# Patient Record
Sex: Female | Born: 1950
Health system: Southern US, Community
[De-identification: ages and names within clinical notes are randomized; demographics above are authoritative.]

## PROBLEM LIST (undated history)

## (undated) DIAGNOSIS — Z8489 Family history of other specified conditions: Secondary | ICD-10-CM

## (undated) DIAGNOSIS — M1990 Unspecified osteoarthritis, unspecified site: Secondary | ICD-10-CM

## (undated) DIAGNOSIS — F419 Anxiety disorder, unspecified: Secondary | ICD-10-CM

## (undated) DIAGNOSIS — N393 Stress incontinence (female) (male): Secondary | ICD-10-CM

## (undated) HISTORY — PX: TONSILLECTOMY: SUR1361

## (undated) HISTORY — PX: OTHER SURGICAL HISTORY: SHX169

## (undated) HISTORY — PX: EYE SURGERY: SHX253

---

## 1998-06-06 ENCOUNTER — Other Ambulatory Visit: Admission: RE | Admit: 1998-06-06 | Discharge: 1998-06-06 | Payer: Self-pay | Admitting: General Surgery

## 2001-01-20 ENCOUNTER — Encounter: Admission: RE | Admit: 2001-01-20 | Discharge: 2001-01-20 | Payer: Self-pay | Admitting: Family Medicine

## 2001-01-20 ENCOUNTER — Encounter: Payer: Self-pay | Admitting: Family Medicine

## 2001-02-17 ENCOUNTER — Other Ambulatory Visit: Admission: RE | Admit: 2001-02-17 | Discharge: 2001-02-17 | Payer: Self-pay | Admitting: Obstetrics and Gynecology

## 2002-04-05 ENCOUNTER — Other Ambulatory Visit: Admission: RE | Admit: 2002-04-05 | Discharge: 2002-04-05 | Payer: Self-pay | Admitting: Obstetrics and Gynecology

## 2002-05-24 ENCOUNTER — Encounter: Payer: Self-pay | Admitting: Obstetrics and Gynecology

## 2002-05-24 ENCOUNTER — Encounter: Admission: RE | Admit: 2002-05-24 | Discharge: 2002-05-24 | Payer: Self-pay | Admitting: Obstetrics and Gynecology

## 2002-06-28 ENCOUNTER — Encounter: Payer: Self-pay | Admitting: Orthopedic Surgery

## 2002-07-05 ENCOUNTER — Inpatient Hospital Stay (HOSPITAL_COMMUNITY): Admission: RE | Admit: 2002-07-05 | Discharge: 2002-07-08 | Payer: Self-pay | Admitting: Orthopedic Surgery

## 2003-11-01 ENCOUNTER — Inpatient Hospital Stay (HOSPITAL_COMMUNITY): Admission: RE | Admit: 2003-11-01 | Discharge: 2003-11-04 | Payer: Self-pay | Admitting: Orthopedic Surgery

## 2004-07-04 ENCOUNTER — Other Ambulatory Visit: Admission: RE | Admit: 2004-07-04 | Discharge: 2004-07-04 | Payer: Self-pay | Admitting: Obstetrics and Gynecology

## 2005-08-10 ENCOUNTER — Other Ambulatory Visit: Admission: RE | Admit: 2005-08-10 | Discharge: 2005-08-10 | Payer: Self-pay | Admitting: Obstetrics and Gynecology

## 2005-08-24 ENCOUNTER — Encounter: Admission: RE | Admit: 2005-08-24 | Discharge: 2005-08-24 | Payer: Self-pay | Admitting: Obstetrics and Gynecology

## 2006-03-04 ENCOUNTER — Other Ambulatory Visit: Admission: RE | Admit: 2006-03-04 | Discharge: 2006-03-04 | Payer: Self-pay | Admitting: Obstetrics and Gynecology

## 2006-03-15 ENCOUNTER — Encounter: Admission: RE | Admit: 2006-03-15 | Discharge: 2006-03-15 | Payer: Self-pay | Admitting: Obstetrics and Gynecology

## 2006-08-16 ENCOUNTER — Encounter: Admission: RE | Admit: 2006-08-16 | Discharge: 2006-08-16 | Payer: Self-pay | Admitting: Obstetrics and Gynecology

## 2006-10-01 ENCOUNTER — Encounter: Admission: RE | Admit: 2006-10-01 | Discharge: 2006-10-01 | Payer: Self-pay | Admitting: Obstetrics and Gynecology

## 2007-08-09 ENCOUNTER — Encounter: Admission: RE | Admit: 2007-08-09 | Discharge: 2007-08-09 | Payer: Self-pay | Admitting: Obstetrics and Gynecology

## 2007-12-22 ENCOUNTER — Encounter: Admission: RE | Admit: 2007-12-22 | Discharge: 2007-12-22 | Payer: Self-pay | Admitting: Obstetrics and Gynecology

## 2008-09-05 ENCOUNTER — Encounter: Admission: RE | Admit: 2008-09-05 | Discharge: 2008-09-05 | Payer: Self-pay | Admitting: Obstetrics and Gynecology

## 2009-10-11 ENCOUNTER — Encounter: Admission: RE | Admit: 2009-10-11 | Discharge: 2009-10-11 | Payer: Self-pay | Admitting: Obstetrics and Gynecology

## 2010-11-03 ENCOUNTER — Other Ambulatory Visit: Payer: Self-pay | Admitting: Obstetrics and Gynecology

## 2010-11-03 DIAGNOSIS — Z1231 Encounter for screening mammogram for malignant neoplasm of breast: Secondary | ICD-10-CM

## 2010-11-12 ENCOUNTER — Ambulatory Visit
Admission: RE | Admit: 2010-11-12 | Discharge: 2010-11-12 | Disposition: A | Discharge status: Home / Self Care | Payer: BC Managed Care – PPO | Source: Ambulatory Visit | Attending: Obstetrics and Gynecology | Admitting: Obstetrics and Gynecology

## 2010-11-12 DIAGNOSIS — Z1231 Encounter for screening mammogram for malignant neoplasm of breast: Secondary | ICD-10-CM

## 2010-12-12 NOTE — Discharge Summary (Signed)
Jacqueline Dunn, Jacqueline Dunn                      ACCOUNT NO.:  [id number]   MEDICAL RECORD NO.:  [id number]                   PATIENT TYPE:  INP   LOCATION:  0481                                 FACILITY:  [facility]   PHYSICIAN:  Vania Rea. Supple, M.D.               DATE OF BIRTH:  03/20/1951   DATE OF ADMISSION:  11/01/2003  DATE OF DISCHARGE:  11/04/2003                                 DISCHARGE SUMMARY   ADMISSION DIAGNOSES:  1. Left knee medial joint osteoarthritis.  2. Hypercholesterolemia.   DISCHARGE DIAGNOSES:  1. Left knee medial joint osteoarthritis.  2. Hypercholesterolemia.   OPERATIONS:  Left knee medial unicondylar arthroplasty.  Surgeon Dr. Francena Hanly, assistant Ralene Bathe, P.A.-C., under general anesthesia.   BRIEF HISTORY:  Ms. Jacqueline Dunn is a very pleasant 60 year old female who is well  known to [place] that we have been following for medial joint OA.  She has  previously undergone a right medial unicondylar arthroplasty and done  extremely well.  She has now progressed to the left knee being extremely  symptomatic.  All of her joint line has been medial.  We did note some mild  changes in the lateral joint line of the patellofemoral joint on x-ray.  However, these were not symptomatic.  The decision was made to go to the  operating room to do a left unicondylar arthroplasty versus a total knee  depending on symptoms and findings intraoperatively.  Risks and benefits  discussed with the patient at length and she is in agreement and wishes to  proceed.   HOSPITAL COURSE:  The patient was admitted and underwent the above-named  procedure and tolerated this well.  All appropriate IV antibiotics and  analgesics were utilized. Postoperatively she was placed on 24 hours of IV  antibiotics and Coumadin for 3 weeks for DVT and PE prophylaxis.  The  patient did extremely well postoperatively.  She met all of her therapy  goals.  By date November 04, 2003 she was stable and ready  for discharge.  She  was discharged home in the care of her family.   LABORATORY DATA:  Hemoglobin 13.7 on admission, postoperatively 11.1 prior  to discharge and stable.  Chemistries within normal limits postoperatively.  Protime was followed by pharmacy on Coumadin for DVT prophylaxis.  Urinalysis negative.  Blood type shows O positive.  EKG showed normal sinus  rhythm.  Preoperative left knee showed degenerative changes most notably to  the medial aspect.   CONDITION ON DISCHARGE:  Stable and improved.   DISCHARGE MEDICATIONS AND PLAN:  1. The patient is being discharged to home.  2. Prescription for provided for Coumadin, Restoril, Robaxin, and Percocet.  3. She is weightbearing as tolerated.  4. Home health physical therapy.  5. Follow up 2 weeks in our office, call for a time.  6. Resume home medications, home diet.     Tracy A. Shuford, P.A.-C.  Vania Rea. Supple, M.D.    TAS/MEDQ  D:  11/22/2003  T:  11/22/2003  Job:  [id number]

## 2010-12-12 NOTE — Op Note (Signed)
NAME:  Jacqueline Dunn, Jacqueline Dunn                      ACCOUNT NO.:  [id number]   MEDICAL RECORD NO.:  [id number]                   PATIENT TYPE:  INP   LOCATION:  X001                                 FACILITY:  [facility]   PHYSICIAN:  Vania Rea. Supple, M.D.               DATE OF BIRTH:  1950/12/01   DATE OF PROCEDURE:  11/01/2003  DATE OF DISCHARGE:                                 OPERATIVE REPORT   PREOPERATIVE DIAGNOSES:  Left knee medial compartment osteoarthrosis.   POSTOPERATIVE DIAGNOSES:  Left knee medial compartment osteoarthrosis.   PROCEDURE:  Left knee medial compartment unicondylar arthroplasty.   SURGEON:  Vania Rea. Supple, M.D.   Threasa HeadsFrench Ana A. Shuford, P.A.-C.   ANESTHESIA:  General endotracheal.   TOURNIQUET TIME:  1 hour and 25 minutes.   ESTIMATED BLOOD LOSS:  150 mL   DRAINS:  None.   IMPLANTS:  Depuy size 2 all polyethylene tibial insert and a size 3  unicondylar femur.   BRIEF HISTORY:  Jacqueline Dunn is a 60 year old female whose had known bilateral  knee medial compartment arthrosis who is now approximately a year and a half  out from her right knee medial compartment unicondylar placement.  This has  done very nicely. The left knee has now become increasingly symptomatic to  the point where she is having significant pain and functional limitations.  Plain radiographs confirm severe medial compartment arthrosis. There is also  some evidence for degenerative changes in the patellofemoral joint and  lateral compartment and preoperatively discussed the potential need for  total knee replacement versus a unicondylar replacement. Jacqueline Dunn  understands and agrees with our plan for unicondylar total knee replacement  based on the intraoperative findings.  We did discuss the possible surgical  complications of bleeding, infection, neurovascular injury, DVT, PE, as well  as persistent pain and possible need for revision to a total knee  arthroplasty.  She understands  and accepts and agrees with our planned  procedure.   DESCRIPTION OF PROCEDURE:  After undergoing routine preoperative evaluation,  the patient received prophylactic antibiotics.  Placed supine on the  operating table and underwent the smooth induction of general endotracheal  anesthesia. Tourniquet applied to the left thigh, left leg was prepped and  draped in a standard fashion. The leg was exsanguinated with the tourniquet  inflated to 350 mmHg.  A medial parapatellar longitudinal incision was made  from one fingerbreadth above the patella to just medial to the tibial  tubercle, total length approximately 10 cm.  Skin flaps were mobilized,  electrocautery used for hemostasis.  A medial parapatellar arthrotomy was  then performed using electrocautery.  We removed approximately a 1/4 of the  infrapatellar fat pad to improve visualization.  We then carefully released  the meniscotibial ligament medially and reflected the medial meniscus to  allow visualization of the joint space.  There was complete loss of all  articular cartilage on the  distal aspect of the medial femoral condyle.  The  patellofemoral joint and lateral compartments were carefully inspected and  the articular cartilage showed some generalized softening but no significant  fissuring or fibrillations and certainly no full thickness defects. Some  peripheral osteophytes were noted. In general, however, the articular  cartilage appeared to be in generally good condition both the patellofemoral  joint and the lateral compartment. With this finding, we elected to proceed  with the unicondylar replacement.  Our attention was initially directed to  the tibial plateau.  We used an extramedullary guide and maintaining a  neutral alignment, a transverse cut was made removing approximately 4 mm of  bone from the medial tibial plateau.  Care was taken to protect the  surrounding soft tissues.  A sagittal saw was then used to make the  sagittal  cut just to the lateral margin of the corresponding point of the lateral  aspect of the medial femoral condyle.  The bone wedge was then removed in  total from the medial tibia.  A trial spacer was then placed and this showed  appropriate resection of bone and good soft tissue balance at this early  stage.  The knee was then placed into extension and utilizing a standard  wedge with a 2 mm step, we pinned for the distal femoral cut.  The distal  femoral cut was then performed and a very small portion of bone was removed.  The cutting guide was then removed.  The distal femoral chamfer guide was  then applied on the distal femoral cut with the knee in flexion. The chamfer  cuts were then made beginning with the anterior and then the posterior and  the oblique chamfer cuts on the femur. We did use a curved gauge to recess  the anterior margin of the femoral implant so that it was flush and had a  smooth contour and smooth transition into the trochlear groove region.  Once  this was completed, trial implants were placed and there was excellent fit  of the femoral component and appropriate sizing of the tibial tray. The size  3 femur had the best fit and the size 2 tibial tray had the best fit.  At  this point, we then placed the keel cutting guide on the distal femur and  utilized a bur to complete the keel and also drill the stabilizing peg hole  into the distal femur. A trial implant was then placed with the trial keel  and excellent fit and good mobility and good stability implants were  confirmed. The trial femur was then removed. The tibial keel cutting guide  was then placed into position. We confirmed that there was appropriate fit  of the tibial tray, proper positioning and then a bur was then used again to  cut the keel on the tibia and then the gouges then used to complete the keel cut for the tibial tray and then a final keeled trial was placed. Once this  was completed,  the joint was then copiously irrigated. The final implants  were then opened and the final implants were then placed into position on a  trial basis and there was excellent fit and position of the implants, good  knee stability in both flexion and extension and good patella tracking.  The  implants were then removed. The knee joint was then pulsatilely lavaged and  meticulous hemostasis was obtained.  The cement was then mixed on the back  table in the  appropriate consistency, the implants were then cemented into  position. Prior to placement of the tibial implant, we did take 10 mL of  0.5% Marcaine with epinephrine and instilled it into the posterior capsular  tissues.  We placed a 4 x 4 sponge in the posterior aspect of the knee prior  to cementing.  The tibial tray was then cemented into position and then the  Raytec was removed to retrieve any cement which had extruded posteriorly.  The femoral condylar implant was then cemented into position and had  excellent fit and position and all extra cement was meticulously removed.  The knee was then held in extension as the cement hardened. Once this was  completed, final inspection and debridement was performed. Soft tissue  balance was excellent in both flexion and extension. Good knee stability  throughout full range of motion.  At this point the tourniquet was then let  down. Hemostasis was obtained.  The incision was then closed with  interrupted figure-of-eight #1 Vicryl sutures for the synovium and medial  parapatellar arthrotomy, 2-0 Vicryl for the subcu and intracuticular 3-0  Monocryl for the skin followed by Steri-Strips.  A bulky dry dressing and  ice pack were placed about the knee.  The patient was then extubated and  taken to the recovery room in stable condition.                                               Vania Rea. Supple, M.D.    KMS/MEDQ  D:  11/01/2003  T:  11/01/2003  Job:  [id number]

## 2010-12-12 NOTE — Op Note (Signed)
NAME:  Jacqueline Dunn, Jacqueline Dunn                      ACCOUNT NO.:  [id number]   MEDICAL RECORD NO.:  [id number]                   PATIENT TYPE:  INP   LOCATION:  X002                                 FACILITY:  [facility]   PHYSICIAN:  Vania Rea. Supple, M.D.               DATE OF BIRTH:  10-18-1950   DATE OF PROCEDURE:  07/05/2002  DATE OF DISCHARGE:                                 OPERATIVE REPORT   PREOPERATIVE DIAGNOSIS:  Right knee end-stage medial compartment  osteoarthrosis.   POSTOPERATIVE DIAGNOSIS:  Right knee end-stage medial compartment  osteoarthrosis.   PROCEDURE:  Cemented DePuy unicompartmental arthroplasty utilizing a size 2  femur and a size 2 tibia.   SURGEON:  Vania Rea. Supple, M.D.   ASSISTANT:  Ollen Gross, M.D.   ANESTHESIA:  General endotracheal.   TOURNIQUET TIME:  1 hour 10 minutes.   ESTIMATED BLOOD LOSS:  Minimal.   DRAINS:  Hemovac x1.   HISTORY:  The patient is a 60 year old female who has had progressively  increasing bilateral knee pain with clinical and radiographic evidence for  end-stage medial compartment arthrosis.  She has failed prolonged attempts  at conservative management, is now so functionally incapacitated that she  presents today for planned unicompartmental replacement of the right knee.   Preoperatively she was counseled on treatment options as well as risks  versus benefits thereof.  Possible surgical complications of bleeding,  infection, neurovascular injury, DVT, PE, persistence of pain, loss of  motion, and possible need for revision surgery, were all reviewed.  She  understands and accepts and agrees with our planned procedure.   DESCRIPTION OF PROCEDURE:  After undergoing routine preoperative evaluation,  the patient received prophylactic antibiotics.  Placed supine on the  operating table and underwent smooth induction of general endotracheal  anesthesia.  Tourniquet applied to the right thigh, and the right leg was  sterilely prepped and draped in the standard fashion.  The leg was  exsanguinated with the tourniquet inflated to 300 mmHg.  A paramedial  longitudinal incision was then made approximately 11 cm in length just along  the medial border of the patella from the tibial tubercle extending  proximally.  Skin flaps were mobilized medially and laterally and  electrocautery was used for hemostasis.  We then made the medial  parapatellar arthrotomy.  The anterior horn of the meniscus was divided, and  we then performed a very slight medial release to allow exposure.  Retractors were placed into the intercondylar notch to retract the contents  of the intercondylar notch, and inspection of the medial compartment showed  complete loss of articular cartilage on the femoral condyle over the  weightbearing portion as well as over the tibial plateau.  We then inspected  the patellofemoral joint and the lateral compartment, and all these areas  showed the articular surfaces to be in excellent condition.  At this point  an extramedullary guide was then applied  to the tibia and the cutting guide  for the tibial cut was then placed in proper position and using a feeler  gauge, we measured for a total of a 4 mm cut from the medial tibial plateau.  The cutting guide was then pegged into position after we determined the  appropriate posterior slope in varus and valgus alignment.  An oscillating  saw was then passed across the medial aspect of the tibia, completing the  cut.  A reciprocating saw was then used to make the appropriate sagittal cut  through the medial tibial metaphysis adjacent to the tibial tubercle and  taking care to protect the contents of the intercondylar notch.  The medial  tibial plateau segment was then removed and we confirmed that there was an  appropriate posterior slope.  At this point the cutting guide was then  removed.  The 7 mm trial was then placed, and this showed excellent soft   tissue balance and alignment with the knee in extension as well as in  flexion.  We then placed the knee into extension and applied the distal  femoral cutting guide, and this was then pinned into position.  The distal  femoral cut was then made with the oscillating saw.  The chamfer cutting  guide was then placed, and we made the anterior, posterior, and chamfer cuts  on the distal femur.  We then applied the trial implants and found good  positioning of the implants and good soft tissue balance.  Through the trial  femoral implant we then cut the stabilizing peg hole as well as keel using  the appropriate instrumentation.  We then applied the tibial keel cutting  guide and used a combination of a bur as well as a gouge to cut the keel for  the tibial tray.  We then took the final implants and tamped them into  position and took the knee through a range of motion.  There was excellent  soft tissue balance and full motion, no evidence for lift-off, and no  evidence for soft tissue impingement.  The final implants were then removed,  the joint was lavaged.  Cement was then mixed on the back table and at the  appropriate consistency, it was introduced carefully to the tibia and the  tibial poly tray was inserted in position.  We had placed a Ray-Tec  posteriorly to catch any excess cement, and we carefully debrided the back  of the joint to remove all excess cement.  The femoral implant was then  placed and in a similar fashion was tamped into position and all extra  cement was carefully removed.  Once the cement was appropriately hardened,  we then re-evaluated knee motion and stability, and excellent positioning  and stability were achieved.  The wound was then finally irrigated.  We did  inject the posterior capsule as well as the skin edges with 0.5% Marcaine  with epinephrine.  A drain was then brought out superomedially.  The medial parapatellar arthrotomy was closed with interrupted #1  figure-of-eight  Vicryl sutures.  The tourniquet was let down at this point.  Hemostasis was  obtained.  The subcu was closed with 2-0 Vicryl and a 4-0 Monocryl was used  to close the skin in a running intracuticular fashion, followed by Steri-  Strips.  A dry dressing was applied about the knee.  The leg was wrapped  with an Ace bandage.  The leg was placed in a knee immobilizer.  The patient  was then extubated and taken to the recovery room in stable condition.                                               Vania Rea. Supple, M.D.    KMS/MEDQ  D:  07/05/2002  T:  07/05/2002  Job:  [id number]

## 2010-12-12 NOTE — H&P (Signed)
NAME:  Jacqueline Dunn, Jacqueline Dunn                      ACCOUNT NO.:  [id number]   MEDICAL RECORD NO.:  [id number]                   PATIENT TYPE:  INP   LOCATION:  X001                                 FACILITY:  [facility]   PHYSICIAN:  Vania Rea. Supple, M.D.               DATE OF BIRTH:  [birth year]   DATE OF ADMISSION:  11/01/2003  DATE OF DISCHARGE:                                HISTORY & PHYSICAL   ADMISSION DIAGNOSIS:  Left knee medial joint osteoarthritis.   BRIEF HISTORY:  Jacqueline Dunn is a very pleasant 60 year old female well known  to [place], who approximately a year and a half ago had undergone right knee  unicondylar arthroplasty for isolated medial compartment osteoarthritis.  She has now progressed with the left knee with the same symptoms.  She has  an isolated medial joint pain.  She is in pain now that is requiring  analgesics to get through her daily activities.  At this time, she had done  so well with the other knee that she wishes to proceed with unicondylar  arthroplasty of the left knee.  There was some mild radiographic evidence  for some lateral compartment.  We also discussed the possibility of needing  to convert to a total knee arthroplasty at the time of surgery, if indeed,  she did have significant patellofemoral or lateral compartment arthrosis.  She is in understanding, as the risks and benefits were discussed at length,  and she wishes to proceed.   PAST MEDICAL HISTORY:  Hypercholesterolemia.   PAST SURGICAL HISTORY:  1. Right knee unicondylar arthroplasty.  2. Tonsillectomy.  3. Wisdom tooth extraction.   ALLERGIES:  No known drug allergies.   MEDICATIONS:  1. Zocor 20 mg q.d.  2. Ultram p.r.n.   SOCIAL HISTORY:  She lives in a [place] house with two steps into the  usual entrance.  She will have postoperative help, and plans are for  discharge home with home health PT and RN.   FAMILY HISTORY:  Significant for hypertension and CVA in her mother and  breast cancer.   REVIEW OF SYSTEMS:  Patient denies any recent fevers, chills, night sweats.  No bleeding tendencies.  CNS:  No blurred or double vision, headaches, or  paralysis.  RESPIRATORY:  No shortness of breath, productive cough, or  hemoptysis.  CARDIOVASCULAR:  No chest pain, angina, or orthopnea.  GI:  No  nausea, vomiting, constipation, melena, or bloody stools.  GENITOURINARY:  No dysuria or hematuria.  MUSCULOSKELETAL:  As pertinent in the present  illness.   PHYSICAL EXAMINATION:  VITAL SIGNS:  Blood pressure on the date of history  and physical was 128/82.  GENERAL:  A well-developed and well-nourished 60 year old female.  HEENT:  Normocephalic.  NECK:  Supple.  No lymphadenopathy.  LUNGS:  Clear to auscultation bilaterally.  HEART:  Regular rate and rhythm.  No murmurs, gallops, rubs, heaves, or  thrills.  ABDOMEN:  Soft, nontender.  EXTREMITIES:  Painful medial joint line with mild effusion on the day of  exam.  There is no lateral joint line tenderness.  No patellofemoral  crepitus.   IMPRESSION:  1. Left knee medial joint osteoarthrosis.  2. Hypercholesterolemia.   PLAN:  At this time, she will be admitted for left knee unicompartmental  arthroplasty versus a total knee, if indeed, there is any other joint  involvement.     Tracy A. Shuford, P.A.-C.                 Vania Rea. Supple, M.D.    TAS/MEDQ  D:  11/01/2003  T:  11/02/2003  Job:  [id number]

## 2010-12-12 NOTE — H&P (Signed)
NAME:  Jacqueline Dunn, Jacqueline Dunn                      ACCOUNT NO.:  [id number]   MEDICAL RECORD NO.:  [id number]                   PATIENT TYPE:  INP   LOCATION:  NA                                   [facility]ACILITY:  [facility]   PHYSICIAN:  Vania Rea. Supple, M.D.               DATE OF BIRTH:  [birth year]   DATE OF ADMISSION:  07/05/2002  DATE OF DISCHARGE:                                HISTORY & PHYSICAL   CHIEF COMPLAINT:  Bilateral knee pain.   HISTORY OF PRESENT ILLNESS:  The patient is a 60 year old female who has had  bilateral isolated medial compartment disease in her knees.  She has  significant pain and deformity, and is requiring narcotics just in order to  get about.  She is having difficulty with medial knee pain and swelling.  She has had a previous left knee arthroscopy, and was noted to have isolated  medial compartment arthrosis.  Currently, her right is more symptomatic then  her left.  She has had appropriate MRI's to document isolated disease, and  at this time we have discussed unicompartmental arthroplasty at length with  her.  She wishes to proceed at this time.  Risks and benefits were discussed  at length.   PAST MEDICAL HISTORY:  Hypercholesterolemia.   PAST SURGICAL HISTORY:  1. Tonsillectomy and adenoidectomy.  2. Left knee arthroscopy in 2/03.   MEDICATIONS:  1. Vicodin p.r.n.  2. Ultracet p.r.n.  3. Bextra 20 mg q.d.  4. Zocor 20 mg q.d.   ALLERGIES:  CODEINE causes nausea and vomiting.   SOCIAL HISTORY:  The patient lives with her mother.  She is currently  divorced.  She lives in one story with three steps into the usual entrance.  Does not use tobacco or alcohol products.   FAMILY HISTORY:  Significant for mother with hypertension.   REVIEW OF SYMPTOMS:  The patient denies any recent fevers, chills, night  sweats, no bleeding tendencies.  CNS:  No blurred or double vision,  seizures, headaches, or paralysis.  RESPIRATORY:  No shortness of breath,  productive cough, or hemoptysis.  CARDIOVASCULAR:  No chest pain, angina, or  orthopnea.  GASTROINTESTINAL:  No nausea, vomiting, constipation, bloody  stool, melena.  GENITOURINARY:  No dysuria or hematuria.  MUSCULOSKELETAL:  Pertinent in present illness.   PHYSICAL EXAMINATION:  GENERAL:  A well-developed, well-nourished 51-year-  old female.  VITAL SIGNS:  Blood pressure 108/68.  HEENT:  Normocephalic.  Extraocular movements were intact.  NECK:  Supple, no lymphadenopathy, no carotid bruits.  CHEST:  Clear to auscultation bilaterally.  No rales or rhonchi.  HEART:  Regular rate and rhythm.  ABDOMEN:  Positive bowel sounds.  EXTREMITIES:  Medial joint line tenderness bilaterally with mild joint  effusion.   IMPRESSION:  1. Bilateral knee medial joint isolated osteoarthritis, right more     symptomatic then left.  2. Hypercholesterolemia.   PLAN:  Right unicondylar knee replacement.  Tracy A. Shuford, P.A.-C.                 Vania Rea. Supple, M.D.    TAS/MEDQ  D:  06/30/2002  T:  06/30/2002  Job:  [id number]

## 2010-12-12 NOTE — Discharge Summary (Signed)
NAME:  Jacqueline Dunn, Jacqueline Dunn                      ACCOUNT NO.:  [id number]   MEDICAL RECORD NO.:  [id number]                   PATIENT TYPE:  INP   LOCATION:  0467                                 FACILITY:  [facility]   PHYSICIAN:  Vania Rea. Supple, M.D.               DATE OF BIRTH:  [birth year]   DATE OF ADMISSION:  07/05/2002  DATE OF DISCHARGE:  07/08/2002                                 DISCHARGE SUMMARY   ADMISSION DIAGNOSES:  1. Bilateral knee medial joint isolated osteoarthritis, right more     symptomatic than left.  2. Hypercholesterolemia.   DISCHARGE DIAGNOSES:  1. Right knee end-stage medial compartment osteoarthritis status post right     knee unicompartmental arthroplasty.  2. Left knee medial compartment osteoarthritis.  3. Hypercholesterolemia.  4. Urinary tract infection.   PROCEDURE:  The patient was taken to the OR on July 05, 2002 and  underwent a cemented Depuy unicompartmental arthroplasty of the right knee.  Surgeon, Dr. Francena Hanly. Assistant, Dr. Ollen Gross. Surgery under  general anesthesia. Tourniquet time 1 hour and 10 minutes. Estimated blood  loss minimal. Hemovac drains x1.   CONSULTATIONS:  None.   BRIEF HISTORY:  The patient is a 60 year old female who has bilateral  isolated medial compartment disease in both knees. She has significant pain  and deformity requiring narcotic pain medications in order to get about. She  has been having difficulty with medial knee pain and swelling. She has  previously undergone a left knee arthroscopy and was noted to have isolated  medial compartment arthrosis. Currently the right knee is more symptomatic  at this time. She has had appropriate MRIs to document isolated disease.  Unicompartmental replacement arthroplasty was discussed with the patient and  she was felt to be an appropriate candidate for this procedure. The risks  and benefits were discussed and she was subsequently admitted to the   hospital.   LABORATORY DATA:  CBC on admission hemoglobin 12.7, hematocrit of 36.7,  white cell count 5.2, red cell count 3.81, differential within normal  limits. Follow-up H&H 11.4 and 33.0, last noted H&H 11.4 and 33.0. PT and  PTT on admission were 13.8 and 29 respectively with an INR of 1.0. Serial  pro times followed per Coumadin protocol. Last noted PT/INR 21.3 and 2.1.  Chem panel on admission all within normal limits. Urinalysis on admission,  small leukocyte esterase and many epithelial cells 0-2 white cells, rare  bacteria. Follow-up urinalysis showed moderate leukocyte esterase, many  epithelial cells, 0-2 white cells, 0-2 red cells.   EKG dated June 28, 2002 normal sinus rhythm with sinus arrhythmia, normal  tracings to compare confirmed by Dr. Nanetta Batty. Chest x-ray dated  June 28, 2002 no active disease.   HOSPITAL COURSE:  The patient admitted to [facility], taken to the  operating room, underwent the above stated procedure without complications.  The patient tolerated the procedure well, later  sent to the recovery room  and then to the orthopedic floor for continued postoperative care. The  patient was placed on weightbearing as tolerated. Physical therapy was  consulted to assist with gait training and ambulation. Placed on Coumadin  protocol for one month. The Hemovac drain placed at time of surgery was  pulled on postoperative day one without difficulty. The patient was given 48  hours of postoperative IV antibiotics. The patient had a fair amount of pain  following the initial procedure. She was initially placed on PCA morphine  for pain control and was later weaned over to p.o. analgesics. Lab values  were checked postoperatively. The hemoglobin and hematocrit remained stable.  She was encouraged to use bedside spirometer. She had slight temperature  noted on postoperative day two of 100.8. The patient did well with physical  therapy up  ambulating approximately 120 feet. By postoperative day two, it  even increased up to greater than 150 feet. She continued to progress well  and by postoperative day three, pain was under much better control, weaned  over to p.o. medications. The incision was healing well. She was seen in  rounds and ready to go home.   DISCHARGE PLAN:  1. The patient was discharged home on July 08, 2002.  2. Discharge diagnosis please see above.  3. Discharge medications:  Percocet for pain, Robaxin for spasm, Coumadin as     per pharmacy protocol. She was placed on Cipro for three days for the     bacteria and UTI.  4. Diet as tolerated.  5. Activity, weightbearing as tolerated to the right lower extremity. Home     health PT and home health nursing through [facility].  6. Follow-up two weeks from surgery, please call the office for an     appointment at [phone] and set up an appointment with Dr. Rennis Chris.   DISPOSITION:  Home.   CONDITION ON DISCHARGE:  Improved.     Alexzandrew L. Julien Girt, P.A.              Vania Rea. Supple, M.D.    ALP/MEDQ  D:  08/08/2002  T:  08/08/2002  Job:  [id number]

## 2011-10-05 ENCOUNTER — Other Ambulatory Visit: Payer: Self-pay | Admitting: Obstetrics and Gynecology

## 2011-10-05 DIAGNOSIS — Z1231 Encounter for screening mammogram for malignant neoplasm of breast: Secondary | ICD-10-CM

## 2011-11-16 ENCOUNTER — Ambulatory Visit
Admission: RE | Admit: 2011-11-16 | Discharge: 2011-11-16 | Disposition: A | Discharge status: Home / Self Care | Payer: BC Managed Care – PPO | Source: Ambulatory Visit | Attending: Obstetrics and Gynecology | Admitting: Obstetrics and Gynecology

## 2011-11-16 DIAGNOSIS — Z1231 Encounter for screening mammogram for malignant neoplasm of breast: Secondary | ICD-10-CM

## 2012-10-20 ENCOUNTER — Other Ambulatory Visit: Payer: Self-pay | Admitting: Obstetrics and Gynecology

## 2012-10-20 ENCOUNTER — Other Ambulatory Visit: Payer: Self-pay

## 2012-10-20 DIAGNOSIS — Z1231 Encounter for screening mammogram for malignant neoplasm of breast: Secondary | ICD-10-CM

## 2012-10-20 DIAGNOSIS — Z780 Asymptomatic menopausal state: Secondary | ICD-10-CM

## 2012-11-25 ENCOUNTER — Ambulatory Visit
Admission: RE | Admit: 2012-11-25 | Discharge: 2012-11-25 | Disposition: A | Discharge status: Home / Self Care | Payer: BC Managed Care – PPO | Source: Ambulatory Visit | Attending: Obstetrics and Gynecology | Admitting: Obstetrics and Gynecology

## 2012-11-25 ENCOUNTER — Ambulatory Visit
Admission: RE | Admit: 2012-11-25 | Discharge: 2012-11-25 | Disposition: A | Discharge status: Home / Self Care | Payer: BC Managed Care – PPO | Source: Ambulatory Visit

## 2012-11-25 DIAGNOSIS — Z1231 Encounter for screening mammogram for malignant neoplasm of breast: Secondary | ICD-10-CM

## 2012-11-25 DIAGNOSIS — Z780 Asymptomatic menopausal state: Secondary | ICD-10-CM

## 2012-11-29 ENCOUNTER — Other Ambulatory Visit: Payer: Self-pay | Admitting: Obstetrics and Gynecology

## 2012-11-29 DIAGNOSIS — R928 Other abnormal and inconclusive findings on diagnostic imaging of breast: Secondary | ICD-10-CM

## 2012-12-07 ENCOUNTER — Ambulatory Visit
Admission: RE | Admit: 2012-12-07 | Discharge: 2012-12-07 | Disposition: A | Discharge status: Home / Self Care | Payer: BC Managed Care – PPO | Source: Ambulatory Visit | Attending: Obstetrics and Gynecology | Admitting: Obstetrics and Gynecology

## 2012-12-07 DIAGNOSIS — R928 Other abnormal and inconclusive findings on diagnostic imaging of breast: Secondary | ICD-10-CM

## 2013-03-14 ENCOUNTER — Other Ambulatory Visit (HOSPITAL_COMMUNITY): Payer: Self-pay | Admitting: Orthopedic Surgery

## 2013-03-14 DIAGNOSIS — M25561 Pain in right knee: Secondary | ICD-10-CM

## 2013-03-23 ENCOUNTER — Encounter (HOSPITAL_COMMUNITY)
Admission: RE | Admit: 2013-03-23 | Discharge: 2013-03-23 | Disposition: A | Discharge status: Home / Self Care | Payer: BC Managed Care – PPO | Source: Ambulatory Visit | Attending: Orthopedic Surgery | Admitting: Orthopedic Surgery

## 2013-03-23 DIAGNOSIS — M25569 Pain in unspecified knee: Secondary | ICD-10-CM | POA: Insufficient documentation

## 2013-03-23 DIAGNOSIS — M25561 Pain in right knee: Secondary | ICD-10-CM

## 2013-03-23 DIAGNOSIS — Z96659 Presence of unspecified artificial knee joint: Secondary | ICD-10-CM | POA: Insufficient documentation

## 2013-03-23 MED ORDER — TECHNETIUM TC 99M MEDRONATE IV KIT
25.0000 | PACK | Freq: Once | INTRAVENOUS | Status: AC | PRN
  Administered 2013-03-23: 25 via INTRAVENOUS

## 2013-10-31 ENCOUNTER — Other Ambulatory Visit: Payer: Self-pay

## 2013-10-31 DIAGNOSIS — Z1231 Encounter for screening mammogram for malignant neoplasm of breast: Secondary | ICD-10-CM

## 2013-10-31 DIAGNOSIS — Z803 Family history of malignant neoplasm of breast: Secondary | ICD-10-CM

## 2013-11-28 ENCOUNTER — Encounter (INDEPENDENT_AMBULATORY_CARE_PROVIDER_SITE_OTHER): Payer: Self-pay

## 2013-11-28 ENCOUNTER — Ambulatory Visit
Admission: RE | Admit: 2013-11-28 | Discharge: 2013-11-28 | Disposition: A | Discharge status: Home / Self Care | Payer: BC Managed Care – PPO | Source: Ambulatory Visit

## 2013-11-28 DIAGNOSIS — Z1231 Encounter for screening mammogram for malignant neoplasm of breast: Secondary | ICD-10-CM

## 2013-11-28 DIAGNOSIS — Z803 Family history of malignant neoplasm of breast: Secondary | ICD-10-CM

## 2014-11-09 ENCOUNTER — Other Ambulatory Visit: Payer: Self-pay

## 2014-11-09 DIAGNOSIS — Z1231 Encounter for screening mammogram for malignant neoplasm of breast: Secondary | ICD-10-CM

## 2014-11-19 NOTE — Progress Notes (Signed)
Matt--- Need Pre Op orders please when you can  Thanks

## 2014-11-25 NOTE — H&P (Signed)
TOTAL KNEE REVISION ADMISSION H&P  Patient is being admitted for right revision total knee arthroplasty.  Subjective:  Chief Complaint:   Failed right UKR  HPI: Jacqueline Dunn, 64 y.o. female, has a history of pain and functional disability in the right knee(s) due to failed previous arthroplasty and patient has failed non-surgical conservative treatments for greater than 12 weeks to include NSAID's and/or analgesics, corticosteriod injections and activity modification. The indications for the revision of the total knee arthroplasty are loosening of one or more components. Onset of symptoms was gradual starting 1+ years ago with gradually worsening course since that time.  Prior procedures on the right knee(s) include unicompartmental arthroplasty in 2003 by Dr. Onnie Graham.  Patient currently rates pain in the right knee(s) at 8 out of 10 with activity. There is night pain, worsening of pain with activity and weight bearing, pain that interferes with activities of daily living, pain with passive range of motion, crepitus and joint swelling.  Patient has evidence of prosthetic loosening by imaging studies. This condition presents safety issues increasing the risk of falls.  There is no current active infection.  Risks, benefits and expectations were discussed with the patient.  Risks including but not limited to the risk of anesthesia, blood clots, nerve damage, blood vessel damage, failure of the prosthesis, infection and up to and including death.  Patient understand the risks, benefits and expectations and wishes to proceed with surgery.   PCP: Kandice Hams, MD  D/C Plans:      Home with HHPT  Post-op Meds:       No Rx given  Tranexamic Acid:      To be given - IV   Decadron:      Is to be given  FYI:     ASA post-op  Norco post-op     Past Medical History  Diagnosis Date  . Family history of adverse reaction to anesthesia     pt had an Aunt who did not wake up well due to Anectine   . Anxiety   . Arthritis   . Stress incontinence     Past Surgical History  Procedure Laterality Date  . Partial right knee replacement       2003  . Partial left knee replacement       2005   . Tonsillectomy      age 62-22  . Colonscopy     . Eye surgery      bilat cataract surgery     No prescriptions prior to admission   Allergies  Allergen Reactions  . Codeine Other (See Comments)    Pt feels as if she is going to pass out unless lying flat; has also experienced nausea and vomiting     History  Substance Use Topics  . Smoking status: Never Smoker   . Smokeless tobacco: Never Used  . Alcohol Use: Yes     Comment: occas social drink with dinner (pina colada)     Review of Systems  Constitutional: Negative.   HENT: Negative.   Eyes: Negative.   Respiratory: Negative.   Cardiovascular: Negative.   Gastrointestinal: Negative.   Genitourinary: Negative.   Musculoskeletal: Positive for joint pain.  Skin: Negative.   Neurological: Negative.   Endo/Heme/Allergies: Negative.   Psychiatric/Behavioral: The patient is nervous/anxious.      Objective:  Physical Exam  Constitutional: She is oriented to person, place, and time. She appears well-developed and well-nourished.  HENT:  Head: Normocephalic.  Eyes: Pupils  are equal, round, and reactive to light.  Neck: Neck supple. No JVD present. No tracheal deviation present. No thyromegaly present.  Cardiovascular: Normal rate, normal heart sounds and intact distal pulses.   Respiratory: Effort normal and breath sounds normal. No stridor. No respiratory distress. She has no wheezes.  GI: Soft. There is no tenderness. There is no guarding.  Musculoskeletal:       Right knee: She exhibits decreased range of motion, swelling, laceration (healed previous incision) and bony tenderness. She exhibits no ecchymosis, no deformity and no erythema. Tenderness found. Medial joint line tenderness noted.  Lymphadenopathy:    She has  no cervical adenopathy.  Neurological: She is alert and oriented to person, place, and time.  Skin: Skin is warm and dry.  Psychiatric: She has a normal mood and affect.     Imaging Review Plain radiographs demonstrate failed previous UKR of the right knee(s). The overall alignment is neutral.There is evidence of loosening of the femoral and tibial components. The bone quality appears to be good for age and reported activity level.   Assessment/Plan:  End stage arthritis, right knee(s) with failed previous arthroplasty.   The patient history, physical examination, clinical judgment of the provider and imaging studies are consistent with failure of the right UKR. Revision total knee arthroplasty is deemed medically necessary. The treatment options including medical management, injection therapy, arthroscopy and revision arthroplasty were discussed at length. The risks and benefits of revision total knee arthroplasty were presented and reviewed. The risks due to aseptic loosening, infection, stiffness, patella tracking problems, thromboembolic complications and other imponderables were discussed. The patient acknowledged the explanation, agreed to proceed with the plan and consent was signed. Patient is being admitted for inpatient treatment for surgery, pain control, PT, OT, prophylactic antibiotics, VTE prophylaxis, progressive ambulation and ADL's and discharge planning.The patient is planning to be discharged home with home health services.      [place] Aman Batley   PA-C  11/28/2014, 12:38 PM

## 2014-11-26 ENCOUNTER — Encounter (HOSPITAL_COMMUNITY): Payer: Self-pay

## 2014-11-26 ENCOUNTER — Encounter (HOSPITAL_COMMUNITY)
Admission: RE | Admit: 2014-11-26 | Discharge: 2014-11-26 | Disposition: A | Discharge status: Home / Self Care | Payer: BLUE CROSS/BLUE SHIELD | Source: Ambulatory Visit | Attending: Orthopedic Surgery | Admitting: Orthopedic Surgery

## 2014-11-26 DIAGNOSIS — Z01812 Encounter for preprocedural laboratory examination: Secondary | ICD-10-CM | POA: Diagnosis not present

## 2014-11-26 HISTORY — DX: Anxiety disorder, unspecified: F41.9

## 2014-11-26 HISTORY — DX: Unspecified osteoarthritis, unspecified site: M19.90

## 2014-11-26 HISTORY — DX: Stress incontinence (female) (male): N39.3

## 2014-11-26 HISTORY — DX: Family history of other specified conditions: Z84.89

## 2014-11-26 LAB — PROTIME-INR
INR: 0.98 (ref 0.00–1.49)
PROTHROMBIN TIME: 13.1 s (ref 11.6–15.2)

## 2014-11-26 LAB — BASIC METABOLIC PANEL
ANION GAP: 7 (ref 5–15)
BUN: 13 mg/dL (ref 6–20)
CHLORIDE: 105 mmol/L (ref 101–111)
CO2: 27 mmol/L (ref 22–32)
CREATININE: 0.85 mg/dL (ref 0.44–1.00)
Calcium: 9.2 mg/dL (ref 8.9–10.3)
GFR calc Af Amer: >60 mL/min (ref >60)
GFR calc non Af Amer: >60 mL/min (ref >60)
Glucose, Bld: 91 mg/dL (ref 70–99)
Potassium: 4.4 mmol/L (ref 3.5–5.1)
SODIUM: 139 mmol/L (ref 135–145)

## 2014-11-26 LAB — CBC
HCT: 41.9 % (ref 36.0–46.0)
Hemoglobin: 13.2 g/dL (ref 12.0–15.0)
MCH: 29.7 pg (ref 26.0–34.0)
MCHC: 31.5 g/dL (ref 30.0–36.0)
MCV: 94.2 fL (ref 78.0–100.0)
PLATELETS: 233 10*3/uL (ref 150–400)
RBC: 4.45 MIL/uL (ref 3.87–5.11)
RDW: 13.9 % (ref 11.5–15.5)
WBC: 5.6 10*3/uL (ref 4.0–10.5)

## 2014-11-26 LAB — URINALYSIS, ROUTINE W REFLEX MICROSCOPIC
Bilirubin Urine: NEGATIVE
GLUCOSE, UA: NEGATIVE mg/dL
Hgb urine dipstick: NEGATIVE
KETONES UR: NEGATIVE mg/dL
Leukocytes, UA: NEGATIVE
Nitrite: NEGATIVE
PH: 6.5 (ref 5.0–8.0)
PROTEIN: NEGATIVE mg/dL
Specific Gravity, Urine: 1.008 (ref 1.005–1.030)
Urobilinogen, UA: 1 mg/dL (ref 0.0–1.0)

## 2014-11-26 LAB — APTT: APTT: 36 s (ref 24–37)

## 2014-11-26 LAB — SURGICAL PCR SCREEN
MRSA, PCR: NEGATIVE
Staphylococcus aureus: NEGATIVE

## 2014-11-26 NOTE — Patient Instructions (Signed)
[place]  11/26/2014   Your procedure is scheduled on:    Monday Dec 03, 2014   Report to [facility] Main Entrance and follow signs to  [facility] arrive at 9:25 AM.  Call this number if you have problems the morning of surgery [phone] or Presurgical Testing [phone].   Remember:  Do not eat food or drink liquids :After Midnight.      Take these medicines the morning of surgery with A SIP OF WATER: Duloxetine (Cymbalta)                               You may not have any metal on your body including hair pins and piercings  Do not wear jewelry, make-up, lotions, powders, perfumes, nail polish or deodorant.  Do not shave body hair  48 hours(2 days) of CHG soap use.              Do not bring valuables to the [facility]. [place].  Contacts, dentures or bridgework may not be worn into surgery.  Leave suitcase in the car. After surgery it may be brought to your room.  For patients admitted to the [facility], checkout time is 11:00 AM the day of discharge.    Special Instructions: review fact sheets for MRSA information, Blood Transfusion fact sheet, Incentive Spirometry.  Remember: Type/Screen "Blue armsbands"- may not be removed once applied(would result in being retested AM of surgery, if removed). ________________________________________________________________________  Jacqueline Dunn - Preparing for Surgery Before surgery, you can play an important role.  Because skin is not sterile, your skin needs to be as free of germs as possible.  You can reduce the number of germs on your skin by washing with CHG (chlorahexidine gluconate) soap before surgery.  CHG is an antiseptic cleaner which kills germs and bonds with the skin to continue killing germs even after washing. Please DO NOT use if you have an allergy to CHG or antibacterial soaps.  If your skin becomes reddened/irritated stop using the CHG and inform your nurse when you  arrive at Short Stay. Do not shave (including legs and underarms) for at least 48 hours prior to the first CHG shower.  You may shave your face/neck. Please follow these instructions carefully:  1.  Shower with CHG Soap the night before surgery and the  morning of Surgery.  2.  If you choose to wash your hair, wash your hair first as usual with your  normal  shampoo.  3.  After you shampoo, rinse your hair and body thoroughly to remove the  shampoo.                           4.  Use CHG as you would any other liquid soap.  You can apply chg directly  to the skin and wash                       Gently with a scrungie or clean washcloth.  5.  Apply the CHG Soap to your body ONLY FROM THE NECK DOWN.   Do not use on face/ open                           Wound or open sores. Avoid contact with eyes, ears mouth and genitals (  private parts).                       Wash face,  Genitals (private parts) with your normal soap.             6.  Wash thoroughly, paying special attention to the area where your surgery  will be performed.  7.  Thoroughly rinse your body with warm water from the neck down.  8.  DO NOT shower/wash with your normal soap after using and rinsing off  the CHG Soap.                9.  Pat yourself dry with a clean towel.            10.  Wear clean pajamas.            11.  Place clean sheets on your bed the night of your first shower and do not  sleep with pets. Day of Surgery : Do not apply any lotions/deodorants the morning of surgery.  Please wear clean clothes to the [facility]/surgery center.  FAILURE TO FOLLOW THESE INSTRUCTIONS MAY RESULT IN THE CANCELLATION OF YOUR SURGERY PATIENT SIGNATURE_________________________________  NURSE SIGNATURE__________________________________  ________________________________________________________________________   Jacqueline Dunn  An incentive spirometer is a tool that can help keep your lungs clear and active. This tool measures how  well you are filling your lungs with each breath. Taking long deep breaths may help reverse or decrease the chance of developing breathing (pulmonary) problems (especially infection) following:  A long period of time when you are unable to move or be active. BEFORE THE PROCEDURE   If the spirometer includes an indicator to show your best effort, your nurse or respiratory therapist will set it to a desired goal.  If possible, sit up straight or lean slightly forward. Try not to slouch.  Hold the incentive spirometer in an upright position. INSTRUCTIONS FOR USE   Sit on the edge of your bed if possible, or sit up as far as you can in bed or on a chair.  Hold the incentive spirometer in an upright position.  Breathe out normally.  Place the mouthpiece in your mouth and seal your lips tightly around it.  Breathe in slowly and as deeply as possible, raising the piston or the ball toward the top of the column.  Hold your breath for 3-5 seconds or for as long as possible. Allow the piston or ball to fall to the bottom of the column.  Remove the mouthpiece from your mouth and breathe out normally.  Rest for a few seconds and repeat Steps 1 through 7 at least 10 times every 1-2 hours when you are awake. Take your time and take a few normal breaths between deep breaths.  The spirometer may include an indicator to show your best effort. Use the indicator as a goal to work toward during each repetition.  After each set of 10 deep breaths, practice coughing to be sure your lungs are clear. If you have an incision (the cut made at the time of surgery), support your incision when coughing by placing a pillow or rolled up towels firmly against it. Once you are able to get out of bed, walk around indoors and cough well. You may stop using the incentive spirometer when instructed by your caregiver.  RISKS AND COMPLICATIONS  Take your time so you do not get dizzy or light-headed.  If you are in  pain, you may  need to take or ask for pain medication before doing incentive spirometry. It is harder to take a deep breath if you are having pain. AFTER USE  Rest and breathe slowly and easily.  It can be helpful to keep track of a log of your progress. Your caregiver can provide you with a simple table to help with this. If you are using the spirometer at home, follow these instructions: [place] IF:   You are having difficultly using the spirometer.  You have trouble using the spirometer as often as instructed.  Your pain medication is not giving enough relief while using the spirometer.  You develop fever of 100.5 F (38.1 C) or higher. SEEK IMMEDIATE MEDICAL CARE IF:   You cough up bloody sputum that had not been present before.  You develop fever of 102 F (38.9 C) or greater.  You develop worsening pain at or near the incision site. MAKE SURE YOU:   Understand these instructions.  Will watch your condition.  Will get help right away if you are not doing well or get worse. Document Released: 11/23/2006 Document Revised: 10/05/2011 Document Reviewed: 01/24/2007 Exit[employer] Patient Information 2014 [employer], [place].   ________________________________________________________________________  WHAT IS A BLOOD TRANSFUSION? Blood Transfusion Information  A transfusion is the replacement of blood or some of its parts. Blood is made up of multiple cells which provide different functions.  Red blood cells carry oxygen and are used for blood loss replacement.  White blood cells fight against infection.  Platelets control bleeding.  Plasma helps clot blood.  Other blood products are available for specialized needs, such as hemophilia or other clotting disorders. BEFORE THE TRANSFUSION  Who gives blood for transfusions?   Healthy volunteers who are fully evaluated to make sure their blood is safe. This is blood bank blood. Transfusion therapy is the safest it has  ever been in the practice of medicine. Before blood is taken from a donor, a complete history is taken to make sure that person has no history of diseases nor engages in risky social behavior (examples are intravenous drug use or sexual activity with multiple partners). The donor's travel history is screened to minimize risk of transmitting infections, such as malaria. The donated blood is tested for signs of infectious diseases, such as HIV and hepatitis. The blood is then tested to be sure it is compatible with you in order to minimize the chance of a transfusion reaction. If you or a relative donates blood, this is often done in anticipation of surgery and is not appropriate for emergency situations. It takes many days to process the donated blood. RISKS AND COMPLICATIONS Although transfusion therapy is very safe and saves many lives, the main dangers of transfusion include:   Getting an infectious disease.  Developing a transfusion reaction. This is an allergic reaction to something in the blood you were given. Every precaution is taken to prevent this. The decision to have a blood transfusion has been considered carefully by your caregiver before blood is given. Blood is not given unless the benefits outweigh the risks. AFTER THE TRANSFUSION  Right after receiving a blood transfusion, you will usually feel much better and more energetic. This is especially true if your red blood cells have gotten low (anemic). The transfusion raises the level of the red blood cells which carry oxygen, and this usually causes an energy increase.  The nurse administering the transfusion will monitor you carefully for complications. HOME CARE INSTRUCTIONS  No special instructions  are needed after a transfusion. You may find your energy is better. Speak with your caregiver about any limitations on activity for underlying diseases you may have. SEEK MEDICAL CARE IF:   Your condition is not improving after your  transfusion.  You develop redness or irritation at the intravenous (IV) site. SEEK IMMEDIATE MEDICAL CARE IF:  Any of the following symptoms occur over the next 12 hours:  Shaking chills.  You have a temperature by mouth above 102 F (38.9 C), not controlled by medicine.  Chest, back, or muscle pain.  People around you feel you are not acting correctly or are confused.  Shortness of breath or difficulty breathing.  Dizziness and fainting.  You get a rash or develop hives.  You have a decrease in urine output.  Your urine turns a dark color or changes to pink, red, or brown. Any of the following symptoms occur over the next 10 days:  You have a temperature by mouth above 102 F (38.9 C), not controlled by medicine.  Shortness of breath.  Weakness after normal activity.  The white part of the eye turns yellow (jaundice).  You have a decrease in the amount of urine or are urinating less often.  Your urine turns a dark color or changes to pink, red, or brown. Document Released: 07/10/2000 Document Revised: 10/05/2011 Document Reviewed: 02/27/2008 [facility] Patient Information 2014 [place], [place].  _______________________________________________________________________

## 2014-11-26 NOTE — Progress Notes (Signed)
Surgical clearance per Dr Delfina Redwood per chart 03/13/2014

## 2014-11-26 NOTE — Progress Notes (Signed)
Notified Dr Redmond School / Anesthesia of family hx of difficulty awakening with Anectine. Anesthesia to see pt day of surgery.

## 2014-11-27 LAB — ABO/RH: ABO/RH(D): O POS

## 2014-12-03 ENCOUNTER — Inpatient Hospital Stay (HOSPITAL_COMMUNITY)
Admission: RE | Admit: 2014-12-03 | Discharge: 2014-12-04 | DRG: 468 | Disposition: A | Discharge status: Home Health | Payer: BLUE CROSS/BLUE SHIELD | Source: Ambulatory Visit | Attending: Orthopedic Surgery | Admitting: Orthopedic Surgery

## 2014-12-03 ENCOUNTER — Encounter (HOSPITAL_COMMUNITY): Payer: Self-pay

## 2014-12-03 ENCOUNTER — Inpatient Hospital Stay (HOSPITAL_COMMUNITY): Payer: BLUE CROSS/BLUE SHIELD | Admitting: Anesthesiology

## 2014-12-03 ENCOUNTER — Encounter (HOSPITAL_COMMUNITY)
Admission: RE | Disposition: A | Discharge status: Home Health | Payer: Self-pay | Source: Ambulatory Visit | Attending: Orthopedic Surgery

## 2014-12-03 DIAGNOSIS — M25561 Pain in right knee: Secondary | ICD-10-CM | POA: Diagnosis present

## 2014-12-03 DIAGNOSIS — Z01812 Encounter for preprocedural laboratory examination: Secondary | ICD-10-CM | POA: Diagnosis not present

## 2014-12-03 DIAGNOSIS — T84062A Wear of articular bearing surface of internal prosthetic right knee joint, initial encounter: Secondary | ICD-10-CM | POA: Diagnosis present

## 2014-12-03 DIAGNOSIS — Z885 Allergy status to narcotic agent status: Secondary | ICD-10-CM | POA: Diagnosis not present

## 2014-12-03 DIAGNOSIS — M179 Osteoarthritis of knee, unspecified: Secondary | ICD-10-CM | POA: Diagnosis present

## 2014-12-03 DIAGNOSIS — Y838 Other surgical procedures as the cause of abnormal reaction of the patient, or of later complication, without mention of misadventure at the time of the procedure: Secondary | ICD-10-CM | POA: Diagnosis present

## 2014-12-03 DIAGNOSIS — Z96659 Presence of unspecified artificial knee joint: Secondary | ICD-10-CM

## 2014-12-03 DIAGNOSIS — Z96651 Presence of right artificial knee joint: Secondary | ICD-10-CM

## 2014-12-03 HISTORY — PX: CONVERSION TO TOTAL KNEE: SHX5785

## 2014-12-03 LAB — TYPE AND SCREEN
ABO/RH(D): O POS
Antibody Screen: NEGATIVE

## 2014-12-03 SURGERY — CONVERSION, ARTHROPLASTY, KNEE, PARTIAL, TO TOTAL KNEE ARTHROPLASTY
Anesthesia: Spinal | Site: Knee | Laterality: Right

## 2014-12-03 MED ORDER — PHENYLEPHRINE HCL 10 MG/ML IJ SOLN
INTRAMUSCULAR | Status: DC | PRN
  Administered 2014-12-03: 120 ug via INTRAVENOUS

## 2014-12-03 MED ORDER — HYDROMORPHONE HCL 1 MG/ML IJ SOLN
0.5000 mg | INTRAMUSCULAR | Status: DC | PRN
  Administered 2014-12-03 – 2014-12-04 (×5): 1 mg via INTRAVENOUS
  Filled 2014-12-03 (×5): qty 1

## 2014-12-03 MED ORDER — SODIUM CHLORIDE 0.9 % IJ SOLN
INTRAMUSCULAR | Status: AC
  Filled 2014-12-03: qty 50

## 2014-12-03 MED ORDER — PROPOFOL 10 MG/ML IV BOLUS
INTRAVENOUS | Status: AC
  Filled 2014-12-03: qty 20

## 2014-12-03 MED ORDER — CEFAZOLIN SODIUM-DEXTROSE 2-3 GM-% IV SOLR
2.0000 g | INTRAVENOUS | Status: AC
  Administered 2014-12-03: 2 g via INTRAVENOUS

## 2014-12-03 MED ORDER — HYDROMORPHONE HCL 1 MG/ML IJ SOLN
INTRAMUSCULAR | Status: AC
  Administered 2014-12-03: 1 mg via INTRAVENOUS
  Filled 2014-12-03: qty 1

## 2014-12-03 MED ORDER — DEXAMETHASONE SODIUM PHOSPHATE 10 MG/ML IJ SOLN
10.0000 mg | Freq: Once | INTRAMUSCULAR | Status: AC
  Administered 2014-12-03: 10 mg via INTRAVENOUS

## 2014-12-03 MED ORDER — POLYETHYLENE GLYCOL 3350 17 G PO PACK
17.0000 g | PACK | Freq: Two times a day (BID) | ORAL | Status: DC
  Administered 2014-12-04: 17 g via ORAL

## 2014-12-03 MED ORDER — SODIUM CHLORIDE 0.9 % IJ SOLN
INTRAMUSCULAR | Status: DC | PRN
  Administered 2014-12-03: 30 mL

## 2014-12-03 MED ORDER — DULOXETINE HCL 60 MG PO CPEP
60.0000 mg | ORAL_CAPSULE | Freq: Every day | ORAL | Status: DC
  Administered 2014-12-04: 60 mg via ORAL
  Filled 2014-12-03: qty 1

## 2014-12-03 MED ORDER — CHLORHEXIDINE GLUCONATE 4 % EX LIQD: 60.0000 mL | Freq: Once | CUTANEOUS | Status: DC

## 2014-12-03 MED ORDER — BUPIVACAINE-EPINEPHRINE (PF) 0.25% -1:200000 IJ SOLN
INTRAMUSCULAR | Status: AC
  Filled 2014-12-03: qty 30

## 2014-12-03 MED ORDER — MAGNESIUM CITRATE PO SOLN: 1.0000 | Freq: Once | ORAL | Status: AC | PRN

## 2014-12-03 MED ORDER — LACTATED RINGERS IV SOLN
INTRAVENOUS | Status: DC
  Administered 2014-12-03: 11:00:00 via INTRAVENOUS
  Administered 2014-12-03: 1000 mL via INTRAVENOUS
  Administered 2014-12-03: 12:00:00 via INTRAVENOUS

## 2014-12-03 MED ORDER — METOCLOPRAMIDE HCL 5 MG/ML IJ SOLN: 5.0000 mg | Freq: Three times a day (TID) | INTRAMUSCULAR | Status: DC | PRN

## 2014-12-03 MED ORDER — METOCLOPRAMIDE HCL 10 MG PO TABS: 5.0000 mg | ORAL_TABLET | Freq: Three times a day (TID) | ORAL | Status: DC | PRN

## 2014-12-03 MED ORDER — CELECOXIB 200 MG PO CAPS
200.0000 mg | ORAL_CAPSULE | Freq: Two times a day (BID) | ORAL | Status: DC
  Administered 2014-12-03 – 2014-12-04 (×2): 200 mg via ORAL
  Filled 2014-12-03 (×3): qty 1

## 2014-12-03 MED ORDER — PROPOFOL INFUSION 10 MG/ML OPTIME
INTRAVENOUS | Status: DC | PRN
  Administered 2014-12-03 (×2): 140 ug/kg/min via INTRAVENOUS

## 2014-12-03 MED ORDER — DEXAMETHASONE SODIUM PHOSPHATE 10 MG/ML IJ SOLN
INTRAMUSCULAR | Status: AC
Start: 2014-12-03 — End: 2014-12-03
  Filled 2014-12-03: qty 1

## 2014-12-03 MED ORDER — METHOCARBAMOL 1000 MG/10ML IJ SOLN
500.0000 mg | Freq: Four times a day (QID) | INTRAVENOUS | Status: DC | PRN
  Administered 2014-12-03 – 2014-12-04 (×3): 500 mg via INTRAVENOUS
  Filled 2014-12-03 (×6): qty 5

## 2014-12-03 MED ORDER — PROPOFOL 10 MG/ML IV BOLUS
INTRAVENOUS | Status: AC
  Filled 2014-12-03: qty 40

## 2014-12-03 MED ORDER — KETOROLAC TROMETHAMINE 30 MG/ML IJ SOLN
INTRAMUSCULAR | Status: AC
  Filled 2014-12-03: qty 1

## 2014-12-03 MED ORDER — METHOCARBAMOL 500 MG PO TABS
500.0000 mg | ORAL_TABLET | Freq: Four times a day (QID) | ORAL | Status: DC | PRN
  Administered 2014-12-04: 500 mg via ORAL
  Filled 2014-12-03 (×2): qty 1

## 2014-12-03 MED ORDER — ASPIRIN EC 325 MG PO TBEC
325.0000 mg | DELAYED_RELEASE_TABLET | Freq: Two times a day (BID) | ORAL | Status: DC
  Administered 2014-12-04: 325 mg via ORAL
  Filled 2014-12-03 (×3): qty 1

## 2014-12-03 MED ORDER — MENTHOL 3 MG MT LOZG: 1.0000 | LOZENGE | OROMUCOSAL | Status: DC | PRN

## 2014-12-03 MED ORDER — DEXAMETHASONE SODIUM PHOSPHATE 10 MG/ML IJ SOLN
10.0000 mg | Freq: Once | INTRAMUSCULAR | Status: DC
  Filled 2014-12-03: qty 1

## 2014-12-03 MED ORDER — HYDROCODONE-ACETAMINOPHEN 7.5-325 MG PO TABS
1.0000 | ORAL_TABLET | ORAL | Status: DC
  Administered 2014-12-03: 1 via ORAL
  Administered 2014-12-03: 2 via ORAL
  Administered 2014-12-03: 1 via ORAL
  Administered 2014-12-04 (×2): 2 via ORAL
  Filled 2014-12-03: qty 2
  Filled 2014-12-03: qty 1
  Filled 2014-12-03: qty 2
  Filled 2014-12-03: qty 1
  Filled 2014-12-03: qty 2

## 2014-12-03 MED ORDER — DIPHENHYDRAMINE HCL 25 MG PO CAPS: 25.0000 mg | ORAL_CAPSULE | Freq: Four times a day (QID) | ORAL | Status: DC | PRN

## 2014-12-03 MED ORDER — SODIUM CHLORIDE 0.9 % IV SOLN
INTRAVENOUS | Status: DC
  Administered 2014-12-03 – 2014-12-04 (×2): via INTRAVENOUS
  Filled 2014-12-03 (×4): qty 1000

## 2014-12-03 MED ORDER — BISACODYL 10 MG RE SUPP: 10.0000 mg | Freq: Every day | RECTAL | Status: DC | PRN

## 2014-12-03 MED ORDER — MIDAZOLAM HCL 5 MG/5ML IJ SOLN
INTRAMUSCULAR | Status: DC | PRN
  Administered 2014-12-03: 2 mg via INTRAVENOUS

## 2014-12-03 MED ORDER — FENTANYL CITRATE (PF) 100 MCG/2ML IJ SOLN
INTRAMUSCULAR | Status: DC | PRN
  Administered 2014-12-03: 50 ug via INTRAVENOUS

## 2014-12-03 MED ORDER — BUPIVACAINE-EPINEPHRINE (PF) 0.25% -1:200000 IJ SOLN
INTRAMUSCULAR | Status: DC | PRN
  Administered 2014-12-03: 30 mL

## 2014-12-03 MED ORDER — DOCUSATE SODIUM 100 MG PO CAPS
100.0000 mg | ORAL_CAPSULE | Freq: Two times a day (BID) | ORAL | Status: DC
Start: 2014-12-03 — End: 2014-12-04
  Administered 2014-12-03 – 2014-12-04 (×2): 100 mg via ORAL

## 2014-12-03 MED ORDER — ONDANSETRON HCL 4 MG/2ML IJ SOLN
INTRAMUSCULAR | Status: AC
  Filled 2014-12-03: qty 2

## 2014-12-03 MED ORDER — KETOROLAC TROMETHAMINE 30 MG/ML IJ SOLN
INTRAMUSCULAR | Status: DC | PRN
  Administered 2014-12-03: 30 mg

## 2014-12-03 MED ORDER — ONDANSETRON HCL 4 MG PO TABS: 4.0000 mg | ORAL_TABLET | Freq: Four times a day (QID) | ORAL | Status: DC | PRN

## 2014-12-03 MED ORDER — SODIUM CHLORIDE 0.9 % IR SOLN
Status: DC | PRN
  Administered 2014-12-03: 1000 mL

## 2014-12-03 MED ORDER — MIDAZOLAM HCL 2 MG/2ML IJ SOLN
INTRAMUSCULAR | Status: AC
  Filled 2014-12-03: qty 2

## 2014-12-03 MED ORDER — ONDANSETRON HCL 4 MG/2ML IJ SOLN: 4.0000 mg | Freq: Four times a day (QID) | INTRAMUSCULAR | Status: DC | PRN

## 2014-12-03 MED ORDER — FERROUS SULFATE 325 (65 FE) MG PO TABS
325.0000 mg | ORAL_TABLET | Freq: Three times a day (TID) | ORAL | Status: DC
Start: 2014-12-03 — End: 2014-12-04
  Filled 2014-12-03 (×5): qty 1

## 2014-12-03 MED ORDER — PHENYLEPHRINE 40 MCG/ML (10ML) SYRINGE FOR IV PUSH (FOR BLOOD PRESSURE SUPPORT)
PREFILLED_SYRINGE | INTRAVENOUS | Status: AC
  Filled 2014-12-03: qty 10

## 2014-12-03 MED ORDER — PROPOFOL 10 MG/ML IV BOLUS
INTRAVENOUS | Status: DC | PRN
  Administered 2014-12-03: 30 mg via INTRAVENOUS

## 2014-12-03 MED ORDER — CEFAZOLIN SODIUM-DEXTROSE 2-3 GM-% IV SOLR
INTRAVENOUS | Status: AC
  Filled 2014-12-03: qty 50

## 2014-12-03 MED ORDER — BUPIVACAINE IN DEXTROSE 0.75-8.25 % IT SOLN
INTRATHECAL | Status: DC | PRN
  Administered 2014-12-03: 2 mL via INTRATHECAL

## 2014-12-03 MED ORDER — PHENOL 1.4 % MT LIQD: 1.0000 | OROMUCOSAL | Status: DC | PRN

## 2014-12-03 MED ORDER — SODIUM CHLORIDE 0.9 % IV SOLN
1000.0000 mg | Freq: Once | INTRAVENOUS | Status: AC
  Administered 2014-12-03: 1000 mg via INTRAVENOUS
  Filled 2014-12-03: qty 10

## 2014-12-03 MED ORDER — HYDROMORPHONE HCL 1 MG/ML IJ SOLN
0.2500 mg | INTRAMUSCULAR | Status: DC | PRN
  Administered 2014-12-03: 0.5 mg via INTRAVENOUS

## 2014-12-03 MED ORDER — CEFAZOLIN SODIUM-DEXTROSE 2-3 GM-% IV SOLR
2.0000 g | Freq: Four times a day (QID) | INTRAVENOUS | Status: AC
  Administered 2014-12-03 – 2014-12-04 (×2): 2 g via INTRAVENOUS
  Filled 2014-12-03 (×2): qty 50

## 2014-12-03 MED ORDER — FENTANYL CITRATE (PF) 100 MCG/2ML IJ SOLN
INTRAMUSCULAR | Status: AC
  Filled 2014-12-03: qty 2

## 2014-12-03 MED ORDER — ALUM & MAG HYDROXIDE-SIMETH 200-200-20 MG/5ML PO SUSP: 30.0000 mL | ORAL | Status: DC | PRN

## 2014-12-03 SURGICAL SUPPLY — 57 items
BAG SPEC THK2 15X12 ZIP CLS (MISCELLANEOUS) ×1
BAG ZIPLOCK 12X15 (MISCELLANEOUS) ×2 IMPLANT
BANDAGE ELASTIC 6 VELCRO ST LF (GAUZE/BANDAGES/DRESSINGS) ×2 IMPLANT
BANDAGE ESMARK 6X9 LF (GAUZE/BANDAGES/DRESSINGS) ×1 IMPLANT
BLADE SAW SGTL 13.0X1.19X90.0M (BLADE) ×2 IMPLANT
BNDG CMPR 9X6 STRL LF SNTH (GAUZE/BANDAGES/DRESSINGS) ×1
BNDG ESMARK 6X9 LF (GAUZE/BANDAGES/DRESSINGS) ×2
BOWL SMART MIX CTS (DISPOSABLE) ×2 IMPLANT
CAP KNEE TOTAL 3 SIGMA ×1 IMPLANT
CEMENT HV SMART SET (Cement) ×4 IMPLANT
CUFF TOURN SGL QUICK 34 (TOURNIQUET CUFF) ×2
CUFF TRNQT CYL 34X4X40X1 (TOURNIQUET CUFF) ×1 IMPLANT
DECANTER SPIKE VIAL GLASS SM (MISCELLANEOUS) ×2 IMPLANT
DRAPE EXTREMITY T 121X128X90 (DRAPE) ×2 IMPLANT
DRAPE POUCH INSTRU U-SHP 10X18 (DRAPES) ×2 IMPLANT
DRAPE U-SHAPE 47X51 STRL (DRAPES) ×2 IMPLANT
DRSG AQUACEL AG ADV 3.5X10 (GAUZE/BANDAGES/DRESSINGS) ×2 IMPLANT
DRSG TEGADERM 4X4.75 (GAUZE/BANDAGES/DRESSINGS) ×1 IMPLANT
DURAPREP 26ML APPLICATOR (WOUND CARE) ×2 IMPLANT
ELECT REM PT RETURN 9FT ADLT (ELECTROSURGICAL) ×2
ELECTRODE REM PT RTRN 9FT ADLT (ELECTROSURGICAL) ×1 IMPLANT
FACESHIELD WRAPAROUND (MASK) ×10 IMPLANT
FACESHIELD WRAPAROUND OR TEAM (MASK) ×5 IMPLANT
GAUZE SPONGE 2X2 8PLY STRL LF (GAUZE/BANDAGES/DRESSINGS) ×1 IMPLANT
GLOVE BIOGEL PI IND STRL 7.5 (GLOVE) ×1 IMPLANT
GLOVE BIOGEL PI IND STRL 8.5 (GLOVE) ×1 IMPLANT
GLOVE BIOGEL PI INDICATOR 7.5 (GLOVE) ×1
GLOVE BIOGEL PI INDICATOR 8.5 (GLOVE) ×1
GLOVE ECLIPSE 8.0 STRL XLNG CF (GLOVE) ×2 IMPLANT
GLOVE ORTHO TXT STRL SZ7.5 (GLOVE) ×4 IMPLANT
GOWN SPEC L3 XXLG W/TWL (GOWN DISPOSABLE) ×4 IMPLANT
GOWN STRL REUS W/TWL LRG LVL3 (GOWN DISPOSABLE) ×2 IMPLANT
HANDPIECE INTERPULSE COAX TIP (DISPOSABLE) ×2
KIT BASIN OR (CUSTOM PROCEDURE TRAY) ×2 IMPLANT
LIQUID BAND (GAUZE/BANDAGES/DRESSINGS) ×2 IMPLANT
MANIFOLD NEPTUNE II (INSTRUMENTS) ×2 IMPLANT
NDL SAFETY ECLIPSE 18X1.5 (NEEDLE) ×1 IMPLANT
NEEDLE HYPO 18GX1.5 SHARP (NEEDLE) ×2
NS IRRIG 1000ML POUR BTL (IV SOLUTION) ×4 IMPLANT
PACK TOTAL JOINT (CUSTOM PROCEDURE TRAY) ×2 IMPLANT
POSITIONER SURGICAL ARM (MISCELLANEOUS) ×2 IMPLANT
SET HNDPC FAN SPRY TIP SCT (DISPOSABLE) ×1 IMPLANT
SET PAD KNEE POSITIONER (MISCELLANEOUS) ×2 IMPLANT
SPONGE GAUZE 2X2 STER 10/PKG (GAUZE/BANDAGES/DRESSINGS)
SUCTION FRAZIER 12FR DISP (SUCTIONS) ×2 IMPLANT
SUT MNCRL AB 4-0 PS2 18 (SUTURE) ×2 IMPLANT
SUT VIC AB 1 CT1 36 (SUTURE) ×6 IMPLANT
SUT VIC AB 2-0 CT1 27 (SUTURE) ×6
SUT VIC AB 2-0 CT1 TAPERPNT 27 (SUTURE) ×3 IMPLANT
SYR 50ML LL SCALE MARK (SYRINGE) ×2 IMPLANT
TOWEL OR 17X26 10 PK STRL BLUE (TOWEL DISPOSABLE) ×4 IMPLANT
TRAY FOLEY W/METER SILVER 14FR (SET/KITS/TRAYS/PACK) ×2 IMPLANT
TRAY SLEEVE CEM ML (Knees) ×1 IMPLANT
TRAY TIB SZ 2 REVISION (Knees) ×1 IMPLANT
WATER STERILE IRR 1500ML POUR (IV SOLUTION) ×2 IMPLANT
WEDGE STEP 1.5X10MM JOINT (Orthopedic Implant) ×1 IMPLANT
WRAP KNEE MAXI GEL POST OP (GAUZE/BANDAGES/DRESSINGS) ×2 IMPLANT

## 2014-12-03 NOTE — Anesthesia Preprocedure Evaluation (Signed)
Anesthesia Evaluation  Patient identified by MRN, date of birth, ID band Patient awake    Reviewed: Allergy & Precautions, NPO status , Patient's Chart, lab work & pertinent test results  History of Anesthesia Complications (+) Family history of anesthesia reaction  Airway Mallampati: II  TM Distance: >3 FB Neck ROM: Full    Dental no notable dental hx.    Pulmonary neg pulmonary ROS,  breath sounds clear to auscultation  Pulmonary exam normal       Cardiovascular negative cardio ROS Normal cardiovascular examRhythm:Regular Rate:Normal     Neuro/Psych Anxiety negative neurological ROS     GI/Hepatic negative GI ROS, Neg liver ROS,   Endo/Other  negative endocrine ROS  Renal/GU negative Renal ROS  negative genitourinary   Musculoskeletal  (+) Arthritis -,   Abdominal   Peds negative pediatric ROS (+)  Hematology negative hematology ROS (+)   Anesthesia Other Findings   Reproductive/Obstetrics negative OB ROS                             Anesthesia Physical Anesthesia Plan  ASA: I  Anesthesia Plan: Spinal   Post-op Pain Management:    Induction: Intravenous  Airway Management Planned:   Additional Equipment:   Intra-op Plan:   Post-operative Plan:   Informed Consent: I have reviewed the patients History and Physical, chart, labs and discussed the procedure including the risks, benefits and alternatives for the proposed anesthesia with the patient or authorized representative who has indicated his/her understanding and acceptance.   Dental advisory given  Plan Discussed with: CRNA  Anesthesia Plan Comments: (Discussed risks and benefits of and differences between spinal and general. Discussed risks of spinal including headache, backache, failure, bleeding, infection, and nerve damage. Patient consents to spinal. Questions answered. Coagulation studies and platelet count  acceptable.)        Anesthesia Quick Evaluation

## 2014-12-03 NOTE — Transfer of Care (Signed)
Immediate Anesthesia Transfer of Care Note  Patient: Jacqueline Dunn  Procedure(s) Performed: Procedure(s): CONVERSION RIGHT UNI KNEE ARTHROPLASTY TO TOTAL KNEE ARTHROPLASTY  (Right)  Patient Location: PACU  Anesthesia Type:Regional and Spinal  Level of Consciousness: awake, sedated and patient cooperative  Airway & Oxygen Therapy: Patient Spontanous Breathing and Patient connected to face mask oxygen  Post-op Assessment: Report given to RN and Post -op Vital signs reviewed and stable  Post vital signs: Reviewed and stable  Last Vitals:  Filed Vitals:   12/03/14 0852  BP: 117/72  Pulse: 80  Temp: 37 C  Resp: 18    Complications: No apparent anesthesia complications

## 2014-12-03 NOTE — Anesthesia Postprocedure Evaluation (Signed)
  Anesthesia Post-op Note  Patient: Jacqueline Dunn  Procedure(s) Performed: Procedure(s) (LRB): CONVERSION RIGHT UNI KNEE ARTHROPLASTY TO TOTAL KNEE ARTHROPLASTY  (Right)  Patient Location: PACU  Anesthesia Type: spinal  Level of Consciousness: awake and alert   Airway and Oxygen Therapy: Patient Spontanous Breathing  Post-op Pain: mild  Post-op Assessment: Post-op Vital signs reviewed, Patient's Cardiovascular Status Stable, Respiratory Function Stable, Patent Airway and No signs of Nausea or vomiting  Last Vitals:  Filed Vitals:   12/03/14 1830  BP: 110/65  Pulse: 80  Temp: 36.3 C  Resp: 16    Post-op Vital Signs: stable   Complications: No apparent anesthesia complications

## 2014-12-03 NOTE — Interval H&P Note (Signed)
History and Physical Interval Note:  12/03/2014 9:49 AM  Jacqueline Dunn  has presented today for surgery, with the diagnosis of FAILED RIGHT UNIKNEE ARTHROPLASTY   The various methods of treatment have been discussed with the patient and family. After consideration of risks, benefits and other options for treatment, the patient has consented to  Procedure(s): CONVERSION RIGHT UNI KNEE ARTHROPLASTY TO TOTAL KNEE ARTHROPLASTY  (Right) as a surgical intervention .  The patient's history has been reviewed, patient examined, no change in status, stable for surgery.  I have reviewed the patient's chart and labs.  Questions were answered to the patient's satisfaction.     Mauri Pole

## 2014-12-03 NOTE — Anesthesia Procedure Notes (Signed)
Spinal Patient location during procedure: OR Staffing Anesthesiologist: Franne Grip Performed by: anesthesiologist  Preanesthetic Checklist Completed: patient identified, site marked, surgical consent, pre-op evaluation, timeout performed, IV checked, risks and benefits discussed and monitors and equipment checked Spinal Block Patient position: sitting Prep: Betadine Patient monitoring: heart rate, continuous pulse ox and blood pressure Approach: midline Location: L3-4 Injection technique: single-shot Needle Needle type: Sprotte  Needle gauge: 24 G Needle length: 9 cm Additional Notes Expiration date of kit checked and confirmed. Patient tolerated procedure well, without complications. Clear CSF obtained. No paresthesia. Tolerated well. Adequate level. Meaningful verbal contact maintained throughout procedure.

## 2014-12-03 NOTE — Progress Notes (Signed)
Utilization review completed.  

## 2014-12-03 NOTE — Brief Op Note (Signed)
12/03/2014  7:10 PM  PATIENT:  Jacqueline Dunn  64 y.o. female  PRE-OPERATIVE DIAGNOSIS:  FAILED RIGHT UNI KNEE ARTHROPLASTY   POST-OPERATIVE DIAGNOSIS:  FAILED RIGHT UNI KNEE ARTHROPLASTY   PROCEDURE:  Procedure(s): CONVERSION RIGHT UNI KNEE ARTHROPLASTY TO TOTAL KNEE ARTHROPLASTY  (Right)  Revision right total knee replacement  SURGEON:  Surgeon(s) and Role:    * Paralee Cancel, MD - Primary    * Paralee Cancel, MD - Primary  PHYSICIAN ASSISTANT: Danae Orleans, PA-C   ANESTHESIA:   spinal  EBL:     BLOOD ADMINISTERED:none  DRAINS: none   LOCAL MEDICATIONS USED:  MARCAINE     SPECIMEN:  No Specimen  DISPOSITION OF SPECIMEN:  N/A  COUNTS:  YES  TOURNIQUET:   Total Tourniquet Time Documented: Thigh (Right) - 61 minutes Total: Thigh (Right) - 61 minutes   DICTATION: .Other Dictation: Dictation Number [id number]  PLAN OF CARE: Admit to inpatient   PATIENT DISPOSITION:  PACU - hemodynamically stable.   Delay start of Pharmacological VTE agent (>24hrs) due to surgical blood loss or risk of bleeding: no

## 2014-12-04 ENCOUNTER — Encounter (HOSPITAL_COMMUNITY): Payer: Self-pay | Admitting: Orthopedic Surgery

## 2014-12-04 ENCOUNTER — Ambulatory Visit: Payer: Self-pay

## 2014-12-04 LAB — BASIC METABOLIC PANEL
ANION GAP: 8 (ref 5–15)
BUN: 9 mg/dL (ref 6–20)
CALCIUM: 8.7 mg/dL — AB (ref 8.9–10.3)
CO2: 25 mmol/L (ref 22–32)
CREATININE: 0.7 mg/dL (ref 0.44–1.00)
Chloride: 107 mmol/L (ref 101–111)
GFR calc non Af Amer: >60 mL/min (ref >60)
Glucose, Bld: 137 mg/dL — ABNORMAL HIGH (ref 70–99)
Potassium: 4.3 mmol/L (ref 3.5–5.1)
Sodium: 140 mmol/L (ref 135–145)

## 2014-12-04 LAB — CBC
HEMATOCRIT: 34.3 % — AB (ref 36.0–46.0)
Hemoglobin: 11.2 g/dL — ABNORMAL LOW (ref 12.0–15.0)
MCH: 31 pg (ref 26.0–34.0)
MCHC: 32.7 g/dL (ref 30.0–36.0)
MCV: 95 fL (ref 78.0–100.0)
PLATELETS: 224 10*3/uL (ref 150–400)
RBC: 3.61 MIL/uL — AB (ref 3.87–5.11)
RDW: 14.1 % (ref 11.5–15.5)
WBC: 10.7 10*3/uL — AB (ref 4.0–10.5)

## 2014-12-04 MED ORDER — OXYCODONE HCL 5 MG PO TABS: 5.0000 mg | ORAL_TABLET | ORAL | Status: DC | PRN

## 2014-12-04 MED ORDER — FERROUS SULFATE 325 (65 FE) MG PO TABS: 325.0000 mg | ORAL_TABLET | Freq: Three times a day (TID) | ORAL | Status: DC

## 2014-12-04 MED ORDER — POLYETHYLENE GLYCOL 3350 17 G PO PACK: 17.0000 g | PACK | Freq: Two times a day (BID) | ORAL | Status: DC

## 2014-12-04 MED ORDER — OXYCODONE HCL 5 MG PO TABS
5.0000 mg | ORAL_TABLET | ORAL | Status: DC | PRN
  Administered 2014-12-04: 10 mg via ORAL
  Administered 2014-12-04: 15 mg via ORAL
  Filled 2014-12-04: qty 3
  Filled 2014-12-04: qty 2

## 2014-12-04 MED ORDER — DOCUSATE SODIUM 100 MG PO CAPS: 100.0000 mg | ORAL_CAPSULE | Freq: Two times a day (BID) | ORAL | Status: DC

## 2014-12-04 MED ORDER — ASPIRIN 325 MG PO TBEC: 325.0000 mg | DELAYED_RELEASE_TABLET | Freq: Two times a day (BID) | ORAL | Status: AC

## 2014-12-04 MED ORDER — METHOCARBAMOL 500 MG PO TABS: 500.0000 mg | ORAL_TABLET | Freq: Four times a day (QID) | ORAL | Status: DC | PRN

## 2014-12-04 NOTE — Progress Notes (Signed)
   12/04/14 1200  PT Visit Information  Last PT Received On 12/04/14  Assistance Needed +1  History of Present Illness s/p R UKR converted to R TKR  PT Time Calculation  PT Start Time (ACUTE ONLY) 1210  PT Stop Time (ACUTE ONLY) 1230  PT Time Calculation (min) (ACUTE ONLY) 20 min  Subjective Data  Subjective I did have my pain pill  Patient Stated Goal home when ready  Precautions  Precautions Knee  Restrictions  Other Position/Activity Restrictions WBAT  Pain Assessment  Pain Assessment 0-10  Pain Score 3  Pain Location R knee with activity  Pain Descriptors / Indicators Aching  Pain Intervention(s) Limited activity within patient's tolerance;Monitored during session;Premedicated before session  Cognition  Arousal/Alertness Awake/alert  Behavior During Therapy WFL for tasks assessed/performed  Overall Cognitive Status Within Functional Limits for tasks assessed  Transfers  Overall transfer level Needs assistance  Equipment used Rolling walker (2 wheeled)  Transfers Sit to/from Stand  Sit to Stand Supervision  General transfer comment verbal cues for hand placement.  Ambulation/Gait  Ambulation/Gait assistance Supervision;Modified independent (Device/Increase time)  Ambulation Distance (Feet) 30 Feet (10' more)  Assistive device Rolling walker (2 wheeled)  General Gait Details cues for sequence, RW position  Gait Pattern/deviations Step-to pattern;Step-through pattern  Stairs Yes  Stairs assistance Min assist  Stair Management No rails;Backwards;With walker  Number of Stairs 2  General stair comments cues for sequence and safe technique  Total Joint Exercises  Ankle Circles/Pumps AROM;10 reps;Both  PT - End of Session  Equipment Utilized During Treatment Gait belt  Activity Tolerance Patient tolerated treatment well;No increased pain  Patient left in chair;with call bell/phone within reach;with family/visitor present  Nurse Communication Mobility status  PT -  Assessment/Plan  PT Plan Current plan remains appropriate  Follow Up Recommendations Home health PT  PT equipment Rolling walker with 5" wheels  PT Goal Progression  Progress towards PT goals Progressing toward goals  Acute Rehab PT Goals  PT Goal Formulation With patient  Time For Goal Achievement 12/05/14  Potential to Achieve Goals Good  PT General Charges  $$ ACUTE PT VISIT 1 Procedure  PT Treatments  $Gait Training 8-22 mins

## 2014-12-04 NOTE — Discharge Instructions (Signed)

## 2014-12-04 NOTE — Progress Notes (Signed)
Patient ID: Jacqueline Dunn, female   DOB: [birth year], 64 y.o.   MRN: [id number] Subjective: 1 Day Post-Op Procedure(s) (LRB): CONVERSION RIGHT UNI KNEE ARTHROPLASTY TO TOTAL KNEE ARTHROPLASTY  (Right)    Patient reports pain as moderate.  Had trouble last night with pain - on Norco.  Otherwise no events  Objective:   VITALS:   Filed Vitals:   12/04/14 0611  BP: 112/61  Pulse: 73  Temp: 97.9 F (36.6 C)  Resp: 16    Neurologically intact Incision: dressing C/D/I  LABS  Recent Labs  12/04/14 0420  HGB 11.2*  HCT 34.3*  WBC 10.7*  PLT 224     Recent Labs  12/04/14 0420  NA 140  K 4.3  BUN 9  CREATININE 0.70  GLUCOSE 137*    No results for input(s): LABPT, INR in the last 72 hours.   Assessment/Plan: 1 Day Post-Op Procedure(s) (LRB): CONVERSION RIGHT UNI KNEE ARTHROPLASTY TO TOTAL KNEE ARTHROPLASTY  (Right)   Advance diet Up with therapy Discharge home with home health today versus tomorrow  Changed pain meds to oxycodone to see if better pain coverage Reviewed procedure and early post-op goals  RTC in 2 weeks

## 2014-12-04 NOTE — Care Management Note (Addendum)
Case Management Note  Patient Details  Name: Jacqueline Dunn MRN: [id number] Date of Birth: 13-Apr-1951  Subjective/Objective:                   CONVERSION RIGHT UNI KNEE ARTHROPLASTY TO TOTAL KNEE ARTHROPLASTY (Right) Action/Plan:  Discharge planning Expected Discharge Date:                  Expected Discharge Plan:  [place]  In-House Referral:     Discharge planning Services  CM Consult  Post Acute Care Choice:  Durable Medical Equipment Choice offered to:  Patient  DME Arranged:  3-N-1, Walker rolling DME Agency:  [place]:  PT [place]:  [place]  Status of Service:  Completed, signed off  Medicare Important Message Given:    Date Medicare IM Given:    Medicare IM give by:    Date Additional Medicare IM Given:    Additional Medicare Important Message give by:     If discussed at [place] of Stay Meetings, dates discussed:    Additional Comments: CM met with pt in room to offer choice of home health agency.  Pt chooses [facility] to render HHPT.  Address and contact information verified by pt. CM called [facility] DME rep, Kristen to please deliver the 3n1 and rolling walker to room prior to discharge today.  Referral for HHPT emailed to [employer], Tim.  No other CM needs were communicated.   Dellie Catholic, RN 12/04/2014, 10:39 AM

## 2014-12-04 NOTE — Evaluation (Addendum)
Occupational Therapy One Time Evaluation Patient Details Name: Jacqueline Dunn MRN: [id number] DOB: Mar 29, 1951 Today's Date: 12/04/2014    History of Present Illness s/p R UKR to R TKR   Clinical Impression   Pt doing very well. All education completed for OT and feel pt is ok to d/c home from OT standpoint. Will sign off.     Follow Up Recommendations  No OT follow up    Equipment Recommendations  None recommended by OT    Recommendations for Other Services       Precautions / Restrictions Precautions Precautions: Knee Restrictions Weight Bearing Restrictions: No Other Position/Activity Restrictions: WBAT      Mobility Bed Mobility               General bed mobility comments: in chair.   Transfers Overall transfer level: Needs assistance Equipment used: Rolling walker (2 wheeled) Transfers: Sit to/from Stand Sit to Stand: Min guard         General transfer comment: verbal cues for hand placement.    Balance                                            ADL Overall ADL's : Needs assistance/impaired Eating/Feeding: Independent;Sitting   Grooming: Wash/dry hands;Set up;Sitting   Upper Body Bathing: Set up;Sitting   Lower Body Bathing: Min guard;Sit to/from stand   Upper Body Dressing : Set up;Sitting   Lower Body Dressing: Min guard;Sit to/from stand   Toilet Transfer: Min guard;Comfort height toilet;Grab bars;RW;BSC   Toileting- Water quality scientist and Hygiene: Min guard;Sit to/from stand         General ADL Comments: Pt plans to sponge bathe initially. Educated on tub seat options but pt states her tub is hard to get in and out of normally so she prefers to sponge for awhile. Advised to still have supervision when she does decide to do first shower. tried 3in1 and comfort height commode and pt did well with comfort height and use of bar like a vanity and feel she will do fine with her regular commode and vanity at home.  Pt threaded underwear on today and stood to pull up wtihout LOB. Discussed not twisting R knee/crossing R LE up when she gets dressed but to make sure she leans forward to her feet.      Vision     Perception     Praxis      Pertinent Vitals/Pain Pain Assessment: 0-10 Pain Score: 7  Pain Location: R knee Pain Descriptors / Indicators: Aching Pain Intervention(s): Repositioned;Ice applied     Hand Dominance     Extremity/Trunk Assessment Upper Extremity Assessment Upper Extremity Assessment: Overall WFL for tasks assessed   Lower Extremity Assessment Lower Extremity Assessment: RLE deficits/detail       Communication Communication Communication: No difficulties   Cognition Arousal/Alertness: Awake/alert Behavior During Therapy: WFL for tasks assessed/performed Overall Cognitive Status: Within Functional Limits for tasks assessed                     General Comments       Exercises       Shoulder Instructions      Home Living Family/patient expects to be discharged to:: Private residence Living Arrangements: Alone Available Help at Discharge: Friend(s) Type of Home: House Home Access: Stairs to enter [employer] of Steps: 2  Home Layout: One level     Bathroom Shower/Tub: Teacher, early years/pre: Standard     Home Equipment: None          Prior Functioning/Environment Level of Independence: Independent             OT Diagnosis: Generalized weakness   OT Problem List:     OT Treatment/Interventions:      OT Goals(Current goals can be found in the care plan section) Acute Rehab OT Goals Patient Stated Goal: home when ready OT Goal Formulation: With patient  OT Frequency:     Barriers to D/C:            Co-evaluation              End of Session Equipment Utilized During Treatment: Rolling walker  Activity Tolerance: Patient tolerated treatment well Patient left: in chair;with call bell/phone  within reach;with family/visitor present   Time: 0955-1020 OT Time Calculation (min): 25 min Charges:  OT General Charges $OT Visit: 1 Procedure OT Evaluation $Initial OT Evaluation Tier I: 1 Procedure OT Treatments $Therapeutic Activity: 8-22 mins G-Codes:    Jules Schick  [phone] 12/04/2014, 10:29 AM

## 2014-12-04 NOTE — Op Note (Signed)
Jacqueline Dunn, Jacqueline Dunn NO.:  [id number]  MEDICAL RECORD NO.:  [id number]  LOCATION:  2993                         FACILITY:  [facility]  PHYSICIAN:  Pietro Cassis. Alvan Dame, M.D.  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  PROCEDURE:  Revision of right total knee arthroplasty.  Components used; DePuy knee system with a size 2 posterior stabilized femoral component, a size 2 MBT revision tray with a 29 cemented sleeve and 10-mm medial hemi-augment and a size 12.5 posterior stabilized insert with a 35-mm patellar button.  SURGEON:  Pietro Cassis. Alvan Dame, M.D.  ASSISTANT:  Danae Orleans, PA-C.  Note that, Mr. Jacqueline Dunn was present for the entirety of the case from preoperative position, perioperative management of the operative extremity, general facilitation of the case, and primary wound closure.  ANESTHESIA:  Spinal.  SPECIMENS:  None.  COMPLICATIONS:  None.  DRAINS:  None.  INDICATIONS FOR PROCEDURE:  Jacqueline Dunn is a pleasant 64 year old female with history of bilateral partial knee arthroplasties of this right knee, had been performed 13 years ago.  The left one done in a similar timeframe.  Her left knee at this point remains asymptomatic. Radiographically, she was noted to have failing of the proximal medial tibia with varus collapse with increasing pain, dysfunction.  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  Based on her progressive worsening of her symptoms, knee revision was indicated.  Risks of infection, DVT, component failure, need for future revision  were all discussed and reviewed.  Consent was obtained for benefit of pain relief.  PROCEDURE IN DETAIL:  The patient was brought to the operative theater. Once adequate anesthesia, preoperative antibiotics, Ancef administered, she was positioned supine with the right thigh tourniquet placed.  The right lower extremity was then prepped and draped in sterile fashion using DeMayo leg holder.  A time-out was performed identifying the patient, planned procedure, and extremity.  The leg was exsanguinated.  Tourniquet was elevated to 250 mmHg. Midline incision was utilized incorporating her old incision.  Median arthrotomy was then made, encountered clear synovial fluid.  Following soft tissue exposure, routine knee replacement procedure was carried out.  Attention was first directed to the patella.  Precut measurement was noted to be about 21 mm.  I resected down to 14 mm and used a 35 patellar button to restore the patellar height.  Lug holes were drilled.  Following patellar preparation, attention was now directed to the femur. Femoral canal was opened with a drill, irrigated to try to prevent fat emboli.  At 3 degrees of valgus, 10 mm of bone was resected off the distal femur and removing the distal femur.  The femoral component was removed fairly easily.  There was no significant bone loss off the medial distal femur.  No augments were required.  Following the distal femoral cut, attention was  now directed to the tibia.  Using an extramedullary guide, I elected to removed 8 mm of bone off the proximal tibia.  The bone was resected off the lateral aspect of the femur and the portion of the medial tibia was exposed with its varus collapse.  At this point, the tibial polyethylene insert was removed easily, I then finished up the proximal tibia cut.  This did leave a defect off the proximal medial tibia.  I spent time at this point debriding osteolytic debris from this proximal tibia to  get back to normal bone quality.  This represented noncontained defect on this medial side of the tibia.  Based on the appearance of the deficit and despite using the size tibial tray and lateralized little bit, I still felt that augmentation was going to be in her best interest to provide most stable bone contact with the metal component.  I also elected based on the size of this defect medially to use a 29 cemented sleeve to provide rotational support and prevent early complication or failure.  With the two tibial tray held into position over the lateral aspect of the proximal tibia, we pinned into place and then drilled for an MBT revision tray.  I then broached with a 29 cemented sleeve.  I then made cuts on the proximal medial tibia to allow for the 10-mm augment to fit best on the remaining bone stalk proximally.  A trial was then placed. Trial femoral component was placed and the trial reduction was now carried out with the 10 and then a 12.5 insert noting that the knee came out to full extension, and ligaments were stable from extension to flexion.  There was no evidence of instability in the medial collateral ligament.  The patella tracked through the trochlea without application of pressure.  Given all of these findings, the trial components were removed from the knee.  The knee was irrigated with normal saline solution with pulse lavage.  We injected the synovial capsule junction of the knee with 30 mL of 0.25% Marcaine with epinephrine, 30 mL of normal saline and 1 mL of Toradol.  Following this, the final components were opened.  The tibial tray was configured on the back table under my direct supervision.  The cement was mixed.  Once the cement was ready, the final components were cemented in place.  The knee was brought to extension with a 12.5 insert.  The cement was allowed to cure.  Once the cement cured, excessive cement was removed throughout the knee.  Once this  was done, the final 12.5 posterior stabilized insert to match the 2 femur was inserted and the knee reduced.  We re-irrigated the knee with normal saline solution.  The extensor mechanism was then reapproximated using #1 Vicryl and 0 V-Loc sutures with the knee in flexion.  The remainder of wound was closed with 2-0 Vicryl and running 4-0 Monocryl.  The knee was cleaned, dried and dressed sterilely using surgical glue and an Aquacel dressing.  The knee was wrapped in Ace wrap.  She was then brought to the recovery room in stable condition, tolerating the procedure well.  Findings were reviewed with the family.     Pietro Cassis Alvan Dame, M.D.     MDO/MEDQ  D:  12/03/2014  T:  12/04/2014  Job:  [id number]

## 2014-12-04 NOTE — Evaluation (Signed)
Physical Therapy Evaluation Patient Details Name: Jacqueline Dunn MRN: [id number] DOB: 08-03-1950 Today's Date: 12/04/2014   History of Present Illness  s/p R UKR to R TKR  Clinical Impression  Pt will benefit from second session of PT to address deficits below; should be ready for D/C from PT standpoint after that. Needs RW and HHPT    Follow Up Recommendations Home health PT    Equipment Recommendations  Rolling walker with 5" wheels    Recommendations for Other Services       Precautions / Restrictions Precautions Precautions: Knee Restrictions Weight Bearing Restrictions: No Other Position/Activity Restrictions: WBAT      Mobility  Bed Mobility Overal bed mobility: Needs Assistance Bed Mobility: Supine to Sit     Supine to sit: Min guard     General bed mobility comments: cues for technique  Transfers Overall transfer level: Needs assistance Equipment used: Rolling walker (2 wheeled) Transfers: Sit to/from Stand Sit to Stand: Min guard         General transfer comment: verbal cues for hand placement.  Ambulation/Gait Ambulation/Gait assistance: Min guard;Supervision Ambulation Distance (Feet): 80 Feet Assistive device: Rolling walker (2 wheeled) Gait Pattern/deviations: Step-to pattern;Step-through pattern     General Gait Details: cues for sequence, RW position  Stairs            Wheelchair Mobility    Modified Rankin (Stroke Patients Only)       Balance                                             Pertinent Vitals/Pain Pain Assessment: 0-10 Pain Score: 7  Pain Location: R knee Pain Descriptors / Indicators: Aching Pain Intervention(s): Repositioned;Ice applied    Home Living Family/patient expects to be discharged to:: Private residence Living Arrangements: Alone Available Help at Discharge: Friend(s) Type of Home: House Home Access: Stairs to enter   Technical brewer of Steps: 2 Home Layout:  One level Home Equipment: None      Prior Function Level of Independence: Independent               Hand Dominance        Extremity/Trunk Assessment   Upper Extremity Assessment: Defer to OT evaluation;Overall WFL for tasks assessed           Lower Extremity Assessment: RLE deficits/detail RLE Deficits / Details: ~ 10 to 60* flexion       Communication   Communication: No difficulties  Cognition Arousal/Alertness: Awake/alert Behavior During Therapy: WFL for tasks assessed/performed Overall Cognitive Status: Within Functional Limits for tasks assessed                      General Comments      Exercises Total Joint Exercises Ankle Circles/Pumps: AROM;10 reps;Both Quad Sets: Both;10 reps;AROM Heel Slides: AROM;AAROM;Right;10 reps Hip ABduction/ADduction: AROM;AAROM;10 reps;Right Straight Leg Raises: AROM;AAROM;Right;10 reps      Assessment/Plan    PT Assessment Patient needs continued PT services  PT Diagnosis Difficulty walking   PT Problem List Decreased strength;Decreased range of motion;Decreased activity tolerance;Decreased balance;Decreased mobility  PT Treatment Interventions DME instruction;Gait training;Functional mobility training;Therapeutic activities;Patient/family education;Stair training;Therapeutic exercise   PT Goals (Current goals can be found in the Care Plan section) Acute Rehab PT Goals Patient Stated Goal: home when ready PT Goal Formulation: With patient Time For Goal Achievement: 12/05/14 Potential  to Achieve Goals: Good    Frequency BID   Barriers to discharge        Co-evaluation               End of Session Equipment Utilized During Treatment: Gait belt Activity Tolerance: Patient tolerated treatment well Patient left: in chair;with call bell/phone within reach Nurse Communication: Mobility status         Time: 0932-1000 PT Time Calculation (min) (ACUTE ONLY): 28 min   Charges:   PT  Evaluation $Initial PT Evaluation Tier I: 1 Procedure PT Treatments $Gait Training: 8-22 mins   PT G Codes:        Jacqueline Dunn Dec 10, 2014, 11:20 AM

## 2014-12-10 NOTE — Discharge Summary (Signed)
Physician Discharge Summary  Patient ID: Jacqueline Dunn MRN: [id number] DOB/AGE: 07-31-1950 64 y.o.  Admit date: 12/03/2014 Discharge date: 12/04/2014   Procedures:  Procedure(s) (LRB): CONVERSION RIGHT UNI KNEE ARTHROPLASTY TO TOTAL KNEE ARTHROPLASTY  (Right)  Attending Physician:  Dr. Paralee Cancel   Admission Diagnoses:   Failed right UKR  Discharge Diagnoses:  Principal Problem:   S/P revision right UKR to TKA Active Problems:   S/P revision of total knee  Past Medical History  Diagnosis Date  . Family history of adverse reaction to anesthesia     pt had an Aunt who did not wake up well due to Anectine  . Anxiety   . Arthritis   . Stress incontinence     HPI:    Jacqueline Dunn, 64 y.o. female, has a history of pain and functional disability in the right knee(s) due to failed previous arthroplasty and patient has failed non-surgical conservative treatments for greater than 12 weeks to include NSAID's and/or analgesics, corticosteriod injections and activity modification. The indications for the revision of the total knee arthroplasty are loosening of one or more components. Onset of symptoms was gradual starting 1+ years ago with gradually worsening course since that time. Prior procedures on the right knee(s) include unicompartmental arthroplasty in 2003 by Dr. Onnie Graham. Patient currently rates pain in the right knee(s) at 8 out of 10 with activity. There is night pain, worsening of pain with activity and weight bearing, pain that interferes with activities of daily living, pain with passive range of motion, crepitus and joint swelling. Patient has evidence of prosthetic loosening by imaging studies. This condition presents safety issues increasing the risk of falls. There is no current active infection. Risks, benefits and expectations were discussed with the patient. Risks including but not limited to the risk of anesthesia, blood clots, nerve damage, blood vessel  damage, failure of the prosthesis, infection and up to and including death. Patient understand the risks, benefits and expectations and wishes to proceed with surgery.   PCP: Kandice Hams, MD   Discharged Condition: good  Hospital Course:  Patient underwent the above stated procedure on 12/03/2014. Patient tolerated the procedure well and brought to the recovery room in good condition and subsequently to the floor.  POD #1 BP: 112/61 ; Pulse: 73 ; Temp: 97.9 F (36.6 C) ; Resp: 16 Patient reports pain as moderate. Had trouble last night with pain - on Norco, changed to Oxycodone. Otherwise no events.  LABS  Basename    HGB  11.2  HCT  34.3    Discharge Exam: General appearance: alert, cooperative and no distress Extremities: Homans sign is negative, no sign of DVT, no edema, redness or tenderness in the calves or thighs and no ulcers, gangrene or trophic changes  Disposition: Home with follow up in 2 weeks   Follow-up Information    Follow up with Mauri Pole, MD. Schedule an appointment as soon as possible for a visit in 2 weeks.   Specialty:  Orthopedic Surgery   Contact information:   [address] [place] [address] [phone]       Follow up with [facility]   Why:  home health physical therapy   Contact information:   [address] [place] [address] [phone]       Follow up with [facility]   Why:  3n1 (over the commode seat) and rolling walker   Contact information:  [address] [place] [address] [phone]       Discharge Instructions    Call MD / Call 911    Complete by:  As directed   If you experience chest pain or shortness of breath, CALL 911 and be transported to the hospital emergency room.  If you develope a fever above 101 F, pus (white drainage) or increased drainage or redness at the wound, or calf pain, call your surgeon's office.     Change dressing     Complete by:  As directed   Maintain surgical dressing until follow up in the clinic. If the edges start to pull up, may reinforce with tape. If the dressing is no longer working, may remove and cover with gauze and tape, but must keep the area dry and clean.  Call with any questions or concerns.     Constipation Prevention    Complete by:  As directed   Drink plenty of fluids.  Prune juice may be helpful.  You may use a stool softener, such as Colace (over the counter) 100 mg twice a day.  Use MiraLax (over the counter) for constipation as needed.     Diet - low sodium heart healthy    Complete by:  As directed      Discharge instructions    Complete by:  As directed   Maintain surgical dressing until follow up in the clinic. If the edges start to pull up, may reinforce with tape. If the dressing is no longer working, may remove and cover with gauze and tape, but must keep the area dry and clean.  Follow up in 2 weeks at [facility]. Call with any questions or concerns.     Increase activity slowly as tolerated    Complete by:  As directed      TED hose    Complete by:  As directed   Use stockings (TED hose) for 2 weeks on both leg(s).  You may remove them at night for sleeping.     Weight bearing as tolerated    Complete by:  As directed   Laterality:  right  Extremity:  Lower             Medication List    STOP taking these medications        traMADol 50 MG tablet  Commonly known as:  ULTRAM      TAKE these medications        aspirin 325 MG EC tablet  Take 1 tablet (325 mg total) by mouth 2 (two) times daily.     celecoxib 200 MG capsule  Commonly known as:  CELEBREX  Take 200 mg by mouth daily.     docusate sodium 100 MG capsule  Commonly known as:  COLACE  Take 1 capsule (100 mg total) by mouth 2 (two) times daily.     DULoxetine 60 MG capsule  Commonly known as:  CYMBALTA  Take 60 mg by mouth daily.     ESTRACE VAGINAL 0.1 MG/GM vaginal cream    Generic drug:  estradiol  Place 1 Applicatorful vaginally every Monday, Wednesday, and Friday.     ferrous sulfate 325 (65 FE) MG tablet  Take 1 tablet (325 mg total) by mouth 3 (three) times daily after meals.     methocarbamol 500 MG tablet  Commonly known as:  ROBAXIN  Take 1 tablet (500 mg total) by mouth every 6 (six) hours as needed for muscle spasms.  oxyCODONE 5 MG immediate release tablet  Commonly known as:  Oxy IR/ROXICODONE  Take 1-3 tablets (5-15 mg total) by mouth every 4 (four) hours as needed for severe pain.     polyethylene glycol packet  Commonly known as:  MIRALAX / GLYCOLAX  Take 17 g by mouth 2 (two) times daily.         Signed: [place] Pugh. Ruth Kovich   PA-C  12/10/2014, 2:59 PM

## 2015-01-07 ENCOUNTER — Ambulatory Visit
Admission: RE | Admit: 2015-01-07 | Discharge: 2015-01-07 | Disposition: A | Discharge status: Home / Self Care | Payer: BLUE CROSS/BLUE SHIELD | Source: Ambulatory Visit

## 2015-01-07 DIAGNOSIS — Z1231 Encounter for screening mammogram for malignant neoplasm of breast: Secondary | ICD-10-CM

## 2015-07-05 ENCOUNTER — Other Ambulatory Visit: Payer: Self-pay | Admitting: Gastroenterology

## 2015-08-27 ENCOUNTER — Encounter (HOSPITAL_COMMUNITY): Payer: Self-pay | Admitting: *Deleted

## 2015-09-09 ENCOUNTER — Ambulatory Visit (HOSPITAL_COMMUNITY): Payer: BLUE CROSS/BLUE SHIELD | Admitting: Anesthesiology

## 2015-09-09 ENCOUNTER — Encounter (HOSPITAL_COMMUNITY): Payer: Self-pay | Admitting: *Deleted

## 2015-09-09 ENCOUNTER — Ambulatory Visit (HOSPITAL_COMMUNITY)
Admission: RE | Admit: 2015-09-09 | Discharge: 2015-09-09 | Disposition: A | Discharge status: Home / Self Care | Payer: BLUE CROSS/BLUE SHIELD | Source: Ambulatory Visit | Attending: Gastroenterology | Admitting: Gastroenterology

## 2015-09-09 ENCOUNTER — Encounter (HOSPITAL_COMMUNITY)
Admission: RE | Disposition: A | Discharge status: Home / Self Care | Payer: Self-pay | Source: Ambulatory Visit | Attending: Gastroenterology

## 2015-09-09 DIAGNOSIS — K219 Gastro-esophageal reflux disease without esophagitis: Secondary | ICD-10-CM | POA: Diagnosis not present

## 2015-09-09 DIAGNOSIS — Z96653 Presence of artificial knee joint, bilateral: Secondary | ICD-10-CM | POA: Insufficient documentation

## 2015-09-09 DIAGNOSIS — E7800 Pure hypercholesterolemia, unspecified: Secondary | ICD-10-CM | POA: Insufficient documentation

## 2015-09-09 DIAGNOSIS — D124 Benign neoplasm of descending colon: Secondary | ICD-10-CM | POA: Diagnosis not present

## 2015-09-09 DIAGNOSIS — M1990 Unspecified osteoarthritis, unspecified site: Secondary | ICD-10-CM | POA: Diagnosis not present

## 2015-09-09 DIAGNOSIS — Z1211 Encounter for screening for malignant neoplasm of colon: Secondary | ICD-10-CM | POA: Insufficient documentation

## 2015-09-09 DIAGNOSIS — Z79899 Other long term (current) drug therapy: Secondary | ICD-10-CM | POA: Diagnosis not present

## 2015-09-09 DIAGNOSIS — D122 Benign neoplasm of ascending colon: Secondary | ICD-10-CM | POA: Insufficient documentation

## 2015-09-09 HISTORY — PX: COLONOSCOPY WITH PROPOFOL: SHX5780

## 2015-09-09 SURGERY — COLONOSCOPY WITH PROPOFOL
Anesthesia: Monitor Anesthesia Care

## 2015-09-09 MED ORDER — PROPOFOL 10 MG/ML IV BOLUS
INTRAVENOUS | Status: DC | PRN
  Administered 2015-09-09: 20 mg via INTRAVENOUS
  Administered 2015-09-09: 40 mg via INTRAVENOUS
  Administered 2015-09-09: 20 mg via INTRAVENOUS
  Administered 2015-09-09: 10 mg via INTRAVENOUS

## 2015-09-09 MED ORDER — PROPOFOL 10 MG/ML IV BOLUS
INTRAVENOUS | Status: AC
  Filled 2015-09-09: qty 40

## 2015-09-09 MED ORDER — PROPOFOL 500 MG/50ML IV EMUL
INTRAVENOUS | Status: DC | PRN
  Administered 2015-09-09: 120 ug/kg/min via INTRAVENOUS

## 2015-09-09 MED ORDER — LACTATED RINGERS IV SOLN
INTRAVENOUS | Status: DC
  Administered 2015-09-09: 1000 mL via INTRAVENOUS

## 2015-09-09 MED ORDER — SODIUM CHLORIDE 0.9 % IV SOLN: INTRAVENOUS | Status: DC

## 2015-09-09 SURGICAL SUPPLY — 22 items

## 2015-09-09 NOTE — H&P (Signed)
  Procedure: Screening colonoscopy. Normal screening colonoscopy performed on 06/15/2005  History: The patient is a 65 year old female born 11-06-1950. She is scheduled to undergo a repeat screening colonoscopy today.  Past medical history: Hypercholesterolemia. Anxiety with depression. Insomnia. Allergic rhinitis. Urinary incontinence. Cataract surgery. Tonsillectomy. Bilateral knee replacement surgeries.  Allergies: Ambien causes confusion  Exam: The patient is alert and lying comfortably on the endoscopy stretcher. Abdomen is soft and nontender to palpation. Lungs are clear to auscultation. Cardiac exam reveals a regular rhythm.  Plan: Proceed with screening colonoscopy

## 2015-09-09 NOTE — Anesthesia Postprocedure Evaluation (Signed)
Anesthesia Post Note  Patient: Jacqueline Dunn  Procedure(s) Performed: Procedure(s) (LRB): COLONOSCOPY WITH PROPOFOL (N/A)  Patient location during evaluation: PACU Anesthesia Type: MAC Level of consciousness: awake and alert Pain management: pain level controlled Vital Signs Assessment: post-procedure vital signs reviewed and stable Respiratory status: spontaneous breathing, nonlabored ventilation, respiratory function stable and patient connected to nasal cannula oxygen Cardiovascular status: blood pressure returned to baseline and stable Postop Assessment: no signs of nausea or vomiting Anesthetic complications: no    Last Vitals:  Filed Vitals:   09/09/15 1110 09/09/15 1120  BP: 126/81 131/84  Pulse: 77 76  Temp:    Resp: 11 20    Last Pain: There were no vitals filed for this visit.               [facility]x First L

## 2015-09-09 NOTE — Anesthesia Preprocedure Evaluation (Signed)
Anesthesia Evaluation  Patient identified by MRN, date of birth, ID band Patient awake    Reviewed: Allergy & Precautions, H&P , NPO status , Patient's Chart, lab work & pertinent test results  History of Anesthesia Complications (+) Family history of anesthesia reaction  Airway Mallampati: II  TM Distance: >3 FB Neck ROM: Full    Dental no notable dental hx. (+) Dental Advisory Given, Teeth Intact   Pulmonary neg pulmonary ROS,    Pulmonary exam normal breath sounds clear to auscultation       Cardiovascular Exercise Tolerance: Good negative cardio ROS Normal cardiovascular exam Rhythm:Regular Rate:Normal     Neuro/Psych Anxiety negative neurological ROS  negative psych ROS   GI/Hepatic negative GI ROS, Neg liver ROS, GERD  Medicated and Controlled,  Endo/Other  negative endocrine ROS  Renal/GU negative Renal ROS  negative genitourinary   Musculoskeletal  (+) Arthritis ,   Abdominal   Peds negative pediatric ROS (+)  Hematology negative hematology ROS (+)   Anesthesia Other Findings   Reproductive/Obstetrics negative OB ROS                             Anesthesia Physical Anesthesia Plan  ASA: I  Anesthesia Plan: MAC   Post-op Pain Management:    Induction:   Airway Management Planned:   Additional Equipment:   Intra-op Plan:   Post-operative Plan:   Informed Consent: I have reviewed the patients History and Physical, chart, labs and discussed the procedure including the risks, benefits and alternatives for the proposed anesthesia with the patient or authorized representative who has indicated his/her understanding and acceptance.   Dental Advisory Given  Plan Discussed with: CRNA  Anesthesia Plan Comments:         Anesthesia Quick Evaluation

## 2015-09-09 NOTE — Transfer of Care (Signed)
Immediate Anesthesia Transfer of Care Note  Patient: Jacqueline Dunn  Procedure(s) Performed: Procedure(s): COLONOSCOPY WITH PROPOFOL (N/A)  Patient Location: PACU and Endoscopy Unit  Anesthesia Type:MAC  Level of Consciousness: awake and patient cooperative  Airway & Oxygen Therapy: Patient Spontanous Breathing and Patient connected to face mask oxygen  Post-op Assessment: Report given to RN and Post -op Vital signs reviewed and stable  Post vital signs: Reviewed and stable  Last Vitals:  Filed Vitals:   09/09/15 0923  BP: 122/75  Temp: 36.7 C  Resp: 13    Complications: No apparent anesthesia complications

## 2015-09-09 NOTE — Discharge Instructions (Signed)

## 2015-09-09 NOTE — Op Note (Signed)
Procedure: Screening colonoscopy. Normal screening colonoscopy performed on 06/15/2005  Endoscopist: Earle Gell  Premedication: Propofol administered by anesthesia  Procedure: The patient was placed in the left lateral decubitus position. Anal inspection and digital rectal exam were normal. The Pentax pediatric colonoscope was introduced into the rectum and advanced to the cecum. A normal-appearing ileocecal valve and appendiceal orifice were identified. Preparation for the exam today was good. Withdrawal time was 18 minutes  Rectum. Normal. Retroflex view of the distal rectum was normal  Sigmoid colon and descending colon. From the mid descending colon, a 4 mm sessile polyp was removed with the cold snare  Splenic flexure. Normal  Transverse colon. Normal  Hepatic flexure. Normal  Ascending colon. Normal  Cecum and ileocecal valve. From the proximal cecum, a 10 mm sessile polyp was removed in piecemeal fashion with the cold snare  Assessment: A small polyp was removed from the mid descending colon and a 10 mm sessile polyp was removed from the proximal cecum. Otherwise normal colonoscopy  Recommendation: I will review the colon polyp pathology to determine when the patient should undergo a repeat colonoscopy

## 2015-09-10 ENCOUNTER — Encounter (HOSPITAL_COMMUNITY): Payer: Self-pay | Admitting: Gastroenterology

## 2015-12-20 ENCOUNTER — Other Ambulatory Visit: Payer: Self-pay

## 2015-12-20 DIAGNOSIS — Z1231 Encounter for screening mammogram for malignant neoplasm of breast: Secondary | ICD-10-CM

## 2016-01-15 ENCOUNTER — Ambulatory Visit
Admission: RE | Admit: 2016-01-15 | Discharge: 2016-01-15 | Disposition: A | Discharge status: Home / Self Care | Payer: BLUE CROSS/BLUE SHIELD | Source: Ambulatory Visit

## 2016-01-15 DIAGNOSIS — Z1231 Encounter for screening mammogram for malignant neoplasm of breast: Secondary | ICD-10-CM

## 2016-01-17 ENCOUNTER — Other Ambulatory Visit: Payer: Self-pay | Admitting: Obstetrics and Gynecology

## 2016-01-17 DIAGNOSIS — R928 Other abnormal and inconclusive findings on diagnostic imaging of breast: Secondary | ICD-10-CM

## 2016-01-20 ENCOUNTER — Other Ambulatory Visit: Payer: Self-pay | Admitting: Obstetrics and Gynecology

## 2016-01-20 ENCOUNTER — Other Ambulatory Visit: Payer: Self-pay

## 2016-01-20 DIAGNOSIS — R928 Other abnormal and inconclusive findings on diagnostic imaging of breast: Secondary | ICD-10-CM

## 2016-01-21 ENCOUNTER — Ambulatory Visit
Admission: RE | Admit: 2016-01-21 | Discharge: 2016-01-21 | Disposition: A | Discharge status: Home / Self Care | Payer: BLUE CROSS/BLUE SHIELD | Source: Ambulatory Visit | Attending: Obstetrics and Gynecology | Admitting: Obstetrics and Gynecology

## 2016-01-21 DIAGNOSIS — R928 Other abnormal and inconclusive findings on diagnostic imaging of breast: Secondary | ICD-10-CM

## 2016-01-21 DIAGNOSIS — R922 Inconclusive mammogram: Secondary | ICD-10-CM | POA: Diagnosis not present

## 2016-01-21 DIAGNOSIS — N6012 Diffuse cystic mastopathy of left breast: Secondary | ICD-10-CM | POA: Diagnosis not present

## 2016-03-31 DIAGNOSIS — R635 Abnormal weight gain: Secondary | ICD-10-CM | POA: Diagnosis not present

## 2016-03-31 DIAGNOSIS — K219 Gastro-esophageal reflux disease without esophagitis: Secondary | ICD-10-CM | POA: Diagnosis not present

## 2016-03-31 DIAGNOSIS — R5383 Other fatigue: Secondary | ICD-10-CM | POA: Diagnosis not present

## 2016-03-31 DIAGNOSIS — Z23 Encounter for immunization: Secondary | ICD-10-CM | POA: Diagnosis not present

## 2016-03-31 DIAGNOSIS — F3341 Major depressive disorder, recurrent, in partial remission: Secondary | ICD-10-CM | POA: Diagnosis not present

## 2016-05-18 DIAGNOSIS — E7800 Pure hypercholesterolemia, unspecified: Secondary | ICD-10-CM | POA: Diagnosis not present

## 2016-05-18 DIAGNOSIS — Z0000 Encounter for general adult medical examination without abnormal findings: Secondary | ICD-10-CM | POA: Diagnosis not present

## 2016-05-18 DIAGNOSIS — Z23 Encounter for immunization: Secondary | ICD-10-CM | POA: Diagnosis not present

## 2016-05-18 DIAGNOSIS — R5383 Other fatigue: Secondary | ICD-10-CM | POA: Diagnosis not present

## 2016-06-16 DIAGNOSIS — E039 Hypothyroidism, unspecified: Secondary | ICD-10-CM | POA: Diagnosis not present

## 2016-08-11 DIAGNOSIS — Z124 Encounter for screening for malignant neoplasm of cervix: Secondary | ICD-10-CM | POA: Diagnosis not present

## 2016-08-11 DIAGNOSIS — Z1151 Encounter for screening for human papillomavirus (HPV): Secondary | ICD-10-CM | POA: Diagnosis not present

## 2016-08-11 DIAGNOSIS — N952 Postmenopausal atrophic vaginitis: Secondary | ICD-10-CM | POA: Diagnosis not present

## 2016-08-11 DIAGNOSIS — N393 Stress incontinence (female) (male): Secondary | ICD-10-CM | POA: Diagnosis not present

## 2016-08-11 DIAGNOSIS — Z113 Encounter for screening for infections with a predominantly sexual mode of transmission: Secondary | ICD-10-CM | POA: Diagnosis not present

## 2016-08-11 DIAGNOSIS — R87610 Atypical squamous cells of undetermined significance on cytologic smear of cervix (ASC-US): Secondary | ICD-10-CM | POA: Diagnosis not present

## 2016-08-11 DIAGNOSIS — Z6826 Body mass index (BMI) 26.0-26.9, adult: Secondary | ICD-10-CM | POA: Diagnosis not present

## 2016-08-11 DIAGNOSIS — Z130 Encounter for screening for diseases of the blood and blood-forming organs and certain disorders involving the immune mechanism: Secondary | ICD-10-CM | POA: Diagnosis not present

## 2016-08-11 DIAGNOSIS — Z01419 Encounter for gynecological examination (general) (routine) without abnormal findings: Secondary | ICD-10-CM | POA: Diagnosis not present

## 2016-08-11 DIAGNOSIS — Z1389 Encounter for screening for other disorder: Secondary | ICD-10-CM | POA: Diagnosis not present

## 2016-09-18 DIAGNOSIS — R350 Frequency of micturition: Secondary | ICD-10-CM | POA: Diagnosis not present

## 2016-09-18 DIAGNOSIS — N3946 Mixed incontinence: Secondary | ICD-10-CM | POA: Diagnosis not present

## 2016-09-18 DIAGNOSIS — R351 Nocturia: Secondary | ICD-10-CM | POA: Diagnosis not present

## 2016-10-02 DIAGNOSIS — R350 Frequency of micturition: Secondary | ICD-10-CM | POA: Diagnosis not present

## 2016-10-02 DIAGNOSIS — R351 Nocturia: Secondary | ICD-10-CM | POA: Diagnosis not present

## 2016-10-02 DIAGNOSIS — N3946 Mixed incontinence: Secondary | ICD-10-CM | POA: Diagnosis not present

## 2016-10-28 DIAGNOSIS — R350 Frequency of micturition: Secondary | ICD-10-CM | POA: Diagnosis not present

## 2016-10-28 DIAGNOSIS — N3946 Mixed incontinence: Secondary | ICD-10-CM | POA: Diagnosis not present

## 2016-11-03 DIAGNOSIS — N393 Stress incontinence (female) (male): Secondary | ICD-10-CM | POA: Diagnosis not present

## 2016-11-13 DIAGNOSIS — N393 Stress incontinence (female) (male): Secondary | ICD-10-CM | POA: Diagnosis not present

## 2016-11-24 DIAGNOSIS — N3946 Mixed incontinence: Secondary | ICD-10-CM | POA: Diagnosis not present

## 2016-11-27 ENCOUNTER — Ambulatory Visit (HOSPITAL_COMMUNITY)
Admission: EM | Admit: 2016-11-27 | Discharge: 2016-11-27 | Disposition: A | Discharge status: Home / Self Care | Payer: BLUE CROSS/BLUE SHIELD | Attending: Family Medicine | Admitting: Family Medicine

## 2016-11-27 ENCOUNTER — Encounter (HOSPITAL_COMMUNITY): Payer: Self-pay | Admitting: Emergency Medicine

## 2016-11-27 DIAGNOSIS — S0181XA Laceration without foreign body of other part of head, initial encounter: Secondary | ICD-10-CM | POA: Diagnosis not present

## 2016-11-27 MED ORDER — LIDOCAINE-EPINEPHRINE (PF) 2 %-1:200000 IJ SOLN
INTRAMUSCULAR | Status: AC
  Filled 2016-11-27: qty 20

## 2016-11-27 NOTE — ED Provider Notes (Signed)
CSN: [id number]     Arrival date & time 11/27/16  1823 History   None    Chief Complaint  Patient presents with  . Laceration   (Consider location/radiation/quality/duration/timing/severity/associated sxs/prior Treatment) Patient c/o laceration to chin.  He TD is utd.   The history is provided by the patient.  Laceration  Location:  Face Length:  2 Depth:  Cutaneous Quality: straight   Bleeding: venous   Time since incident:  2 hours Laceration mechanism:  Blunt object Pain details:    Quality:  Aching   Timing:  Constant   Progression:  Worsening   Past Medical History:  Diagnosis Date  . Anxiety   . Arthritis   . Family history of adverse reaction to anesthesia    pt had an Aunt who did not wake up well due to Anectine  . Stress incontinence    Past Surgical History:  Procedure Laterality Date  . COLONOSCOPY WITH PROPOFOL N/A 09/09/2015   Procedure: COLONOSCOPY WITH PROPOFOL;  Surgeon: Garlan Fair, MD;  Location: [facility] END[facility]COPY;  Service: Endoscopy;  Laterality: N/A;  . colonscopy     . CONVERSION TO TOTAL KNEE Right 12/03/2014   Procedure: CONVERSION RIGHT UNI KNEE ARTHROPLASTY TO TOTAL KNEE ARTHROPLASTY ;  Surgeon: Paralee Cancel, MD;  Location: [facility] ORS;  Service: Orthopedics;  Laterality: Right;  . EYE SURGERY     bilat cataract surgery   . partial left knee replacement      2005   . partial right knee replacement      2003  . TONSILLECTOMY     age 4-22   History reviewed. No pertinent family history. Social History  Substance Use Topics  . Smoking status: Never Smoker  . Smokeless tobacco: Never Used  . Alcohol use Yes     Comment: occas social drink with dinner (pina colada)   OB History    No data available     Review of Systems  Constitutional: Negative.   HENT: Negative.   Eyes: Negative.   Respiratory: Negative.   Cardiovascular: Negative.   Gastrointestinal: Negative.   Endocrine: Negative.   Genitourinary: Negative.   Musculoskeletal:  Negative.   Skin: Positive for wound.  Allergic/Immunologic: Negative.   Neurological: Negative.   Hematological: Negative.   Psychiatric/Behavioral: Negative.     Allergies  Codeine  Home Medications   Prior to Admission medications   Medication Sig Start Date End Date Taking? Authorizing Provider  DULoxetine (CYMBALTA) 60 MG capsule Take 60 mg by mouth every morning.  10/12/14  Yes Historical Provider, MD  omeprazole (PRILOSEC OTC) 20 MG tablet Take 20 mg by mouth daily as needed (heart burn).   Yes Historical Provider, MD   Meds Ordered and Administered this Visit  Medications - No data to display  BP 134/89 (BP Location: Right Arm)   Pulse 86   Temp 98.3 F (36.8 C) (Oral)   Resp 18   SpO2 98%  No data found.   Physical Exam  Constitutional: She appears well-developed and well-nourished.  HENT:  Head: Normocephalic and atraumatic.  Eyes: Conjunctivae and EOM are normal. Pupils are equal, round, and reactive to light.  Cardiovascular: Normal rate, regular rhythm and normal heart sounds.   Pulmonary/Chest: Effort normal and breath sounds normal.  Abdominal: Soft.  Skin:  2 cm laceration  Nursing note and vitals reviewed.   Urgent Care Course     .Marland KitchenLaceration Repair Date/Time: 11/27/2016 8:26 PM Performed by: Lysbeth Penner Authorized by: Patrecia Pour  Consent:    Consent obtained:  Verbal   Consent given by:  Patient   Risks discussed:  Infection and pain   Alternatives discussed:  No treatment Anesthesia (see MAR for exact dosages):    Anesthesia method:  Local infiltration   Local anesthetic:  Lidocaine 2% WITH epi Laceration details:    Location:  Face   Face location:  Chin   Length (cm):  2   Depth (mm):  2 Repair type:    Repair type:  Simple Exploration:    Hemostasis achieved with:  Direct pressure   Wound exploration: wound explored through full range of motion     Contaminated: no   Treatment:    Area cleansed with:  Betadine and  saline   Amount of cleaning:  Standard   Visualized foreign bodies/material removed: yes   Skin repair:    Repair method:  Sutures   Suture size:  5-0   Suture material:  Prolene   Number of sutures:  4 Approximation:    Approximation:  Close   Vermilion border: well-aligned   Post-procedure details:    Dressing:  Open (no dressing)   Patient tolerance of procedure:  Tolerated well, no immediate complications   (including critical care time)  Labs Review Labs Reviewed - No data to display  Imaging Review No results found.   Visual Acuity Review  Right Eye Distance:   Left Eye Distance:   Bilateral Distance:    Right Eye Near:   Left Eye Near:    Bilateral Near:         MDM   1. Chin laceration, initial encounter    #4 4.0 prolene sutures      Lysbeth Penner, FNP 11/27/16 2027

## 2016-11-27 NOTE — ED Triage Notes (Signed)
The patient presented to the [facility] with a complaint of a laceration to her chin secondary to a fall that occurred today.

## 2016-11-27 NOTE — Discharge Instructions (Signed)
Please follow up in 7 days for suture removal

## 2017-01-06 ENCOUNTER — Other Ambulatory Visit: Payer: Self-pay | Admitting: Obstetrics and Gynecology

## 2017-01-06 DIAGNOSIS — Z1231 Encounter for screening mammogram for malignant neoplasm of breast: Secondary | ICD-10-CM

## 2017-01-22 ENCOUNTER — Ambulatory Visit
Admission: RE | Admit: 2017-01-22 | Discharge: 2017-01-22 | Disposition: A | Discharge status: Home / Self Care | Payer: BLUE CROSS/BLUE SHIELD | Source: Ambulatory Visit | Attending: Obstetrics and Gynecology | Admitting: Obstetrics and Gynecology

## 2017-01-22 DIAGNOSIS — Z1231 Encounter for screening mammogram for malignant neoplasm of breast: Secondary | ICD-10-CM

## 2017-02-04 DIAGNOSIS — R350 Frequency of micturition: Secondary | ICD-10-CM | POA: Diagnosis not present

## 2017-02-04 DIAGNOSIS — N3946 Mixed incontinence: Secondary | ICD-10-CM | POA: Diagnosis not present

## 2017-02-11 DIAGNOSIS — D251 Intramural leiomyoma of uterus: Secondary | ICD-10-CM | POA: Diagnosis not present

## 2017-02-11 DIAGNOSIS — R1031 Right lower quadrant pain: Secondary | ICD-10-CM | POA: Diagnosis not present

## 2017-02-12 DIAGNOSIS — R109 Unspecified abdominal pain: Secondary | ICD-10-CM | POA: Diagnosis not present

## 2017-02-12 DIAGNOSIS — R3915 Urgency of urination: Secondary | ICD-10-CM | POA: Diagnosis not present

## 2017-02-12 DIAGNOSIS — F324 Major depressive disorder, single episode, in partial remission: Secondary | ICD-10-CM | POA: Diagnosis not present

## 2017-02-12 DIAGNOSIS — H9209 Otalgia, unspecified ear: Secondary | ICD-10-CM | POA: Diagnosis not present

## 2017-02-16 DIAGNOSIS — R1033 Periumbilical pain: Secondary | ICD-10-CM | POA: Diagnosis not present

## 2017-02-16 DIAGNOSIS — R3915 Urgency of urination: Secondary | ICD-10-CM | POA: Diagnosis not present

## 2017-02-17 DIAGNOSIS — H9209 Otalgia, unspecified ear: Secondary | ICD-10-CM | POA: Diagnosis not present

## 2017-02-17 DIAGNOSIS — R110 Nausea: Secondary | ICD-10-CM | POA: Diagnosis not present

## 2017-02-17 DIAGNOSIS — R109 Unspecified abdominal pain: Secondary | ICD-10-CM | POA: Diagnosis not present

## 2017-03-09 DIAGNOSIS — R42 Dizziness and giddiness: Secondary | ICD-10-CM | POA: Diagnosis not present

## 2017-03-09 DIAGNOSIS — H903 Sensorineural hearing loss, bilateral: Secondary | ICD-10-CM | POA: Diagnosis not present

## 2017-03-09 DIAGNOSIS — H9313 Tinnitus, bilateral: Secondary | ICD-10-CM | POA: Diagnosis not present

## 2017-04-01 DIAGNOSIS — R102 Pelvic and perineal pain: Secondary | ICD-10-CM | POA: Diagnosis not present

## 2017-05-26 DIAGNOSIS — E663 Overweight: Secondary | ICD-10-CM | POA: Diagnosis not present

## 2017-05-26 DIAGNOSIS — Z0000 Encounter for general adult medical examination without abnormal findings: Secondary | ICD-10-CM | POA: Diagnosis not present

## 2017-05-26 DIAGNOSIS — F324 Major depressive disorder, single episode, in partial remission: Secondary | ICD-10-CM | POA: Diagnosis not present

## 2017-05-26 DIAGNOSIS — M8588 Other specified disorders of bone density and structure, other site: Secondary | ICD-10-CM | POA: Diagnosis not present

## 2017-05-26 DIAGNOSIS — Z23 Encounter for immunization: Secondary | ICD-10-CM | POA: Diagnosis not present

## 2017-05-26 DIAGNOSIS — E7800 Pure hypercholesterolemia, unspecified: Secondary | ICD-10-CM | POA: Diagnosis not present

## 2017-07-13 DIAGNOSIS — H43813 Vitreous degeneration, bilateral: Secondary | ICD-10-CM | POA: Diagnosis not present

## 2017-08-17 DIAGNOSIS — H2511 Age-related nuclear cataract, right eye: Secondary | ICD-10-CM | POA: Diagnosis not present

## 2017-08-17 DIAGNOSIS — H26492 Other secondary cataract, left eye: Secondary | ICD-10-CM | POA: Diagnosis not present

## 2017-08-17 DIAGNOSIS — H18413 Arcus senilis, bilateral: Secondary | ICD-10-CM | POA: Diagnosis not present

## 2017-08-17 DIAGNOSIS — H26491 Other secondary cataract, right eye: Secondary | ICD-10-CM | POA: Diagnosis not present

## 2017-08-17 DIAGNOSIS — Z961 Presence of intraocular lens: Secondary | ICD-10-CM | POA: Diagnosis not present

## 2017-09-07 DIAGNOSIS — H26492 Other secondary cataract, left eye: Secondary | ICD-10-CM | POA: Diagnosis not present

## 2017-09-07 DIAGNOSIS — Z961 Presence of intraocular lens: Secondary | ICD-10-CM | POA: Diagnosis not present

## 2017-09-15 DIAGNOSIS — Z130 Encounter for screening for diseases of the blood and blood-forming organs and certain disorders involving the immune mechanism: Secondary | ICD-10-CM | POA: Diagnosis not present

## 2017-09-15 DIAGNOSIS — Z6826 Body mass index (BMI) 26.0-26.9, adult: Secondary | ICD-10-CM | POA: Diagnosis not present

## 2017-09-15 DIAGNOSIS — Z01419 Encounter for gynecological examination (general) (routine) without abnormal findings: Secondary | ICD-10-CM | POA: Diagnosis not present

## 2017-09-15 DIAGNOSIS — Z1389 Encounter for screening for other disorder: Secondary | ICD-10-CM | POA: Diagnosis not present

## 2017-09-30 DIAGNOSIS — H9209 Otalgia, unspecified ear: Secondary | ICD-10-CM | POA: Diagnosis not present

## 2017-09-30 DIAGNOSIS — M26622 Arthralgia of left temporomandibular joint: Secondary | ICD-10-CM | POA: Diagnosis not present

## 2018-01-25 ENCOUNTER — Other Ambulatory Visit: Payer: Self-pay | Admitting: Obstetrics and Gynecology

## 2018-01-25 DIAGNOSIS — E2839 Other primary ovarian failure: Secondary | ICD-10-CM

## 2018-01-26 ENCOUNTER — Other Ambulatory Visit: Payer: Self-pay | Admitting: Obstetrics and Gynecology

## 2018-01-26 DIAGNOSIS — Z1231 Encounter for screening mammogram for malignant neoplasm of breast: Secondary | ICD-10-CM

## 2018-01-28 ENCOUNTER — Other Ambulatory Visit: Payer: BLUE CROSS/BLUE SHIELD

## 2018-03-11 ENCOUNTER — Ambulatory Visit
Admission: RE | Admit: 2018-03-11 | Discharge: 2018-03-11 | Disposition: A | Discharge status: Home / Self Care | Payer: BLUE CROSS/BLUE SHIELD | Source: Ambulatory Visit | Attending: Obstetrics and Gynecology | Admitting: Obstetrics and Gynecology

## 2018-03-11 DIAGNOSIS — Z780 Asymptomatic menopausal state: Secondary | ICD-10-CM | POA: Diagnosis not present

## 2018-03-11 DIAGNOSIS — M8589 Other specified disorders of bone density and structure, multiple sites: Secondary | ICD-10-CM | POA: Diagnosis not present

## 2018-03-11 DIAGNOSIS — Z1231 Encounter for screening mammogram for malignant neoplasm of breast: Secondary | ICD-10-CM

## 2018-03-11 DIAGNOSIS — E2839 Other primary ovarian failure: Secondary | ICD-10-CM

## 2018-05-25 DIAGNOSIS — D485 Neoplasm of uncertain behavior of skin: Secondary | ICD-10-CM | POA: Diagnosis not present

## 2018-05-25 DIAGNOSIS — D225 Melanocytic nevi of trunk: Secondary | ICD-10-CM | POA: Diagnosis not present

## 2018-05-25 DIAGNOSIS — L820 Inflamed seborrheic keratosis: Secondary | ICD-10-CM | POA: Diagnosis not present

## 2018-05-25 DIAGNOSIS — L821 Other seborrheic keratosis: Secondary | ICD-10-CM | POA: Diagnosis not present

## 2018-06-13 DIAGNOSIS — E7800 Pure hypercholesterolemia, unspecified: Secondary | ICD-10-CM | POA: Diagnosis not present

## 2018-06-13 DIAGNOSIS — F324 Major depressive disorder, single episode, in partial remission: Secondary | ICD-10-CM | POA: Diagnosis not present

## 2018-06-13 DIAGNOSIS — Z0000 Encounter for general adult medical examination without abnormal findings: Secondary | ICD-10-CM | POA: Diagnosis not present

## 2018-06-13 DIAGNOSIS — F419 Anxiety disorder, unspecified: Secondary | ICD-10-CM | POA: Diagnosis not present

## 2018-06-21 DIAGNOSIS — R946 Abnormal results of thyroid function studies: Secondary | ICD-10-CM | POA: Diagnosis not present

## 2018-08-03 DIAGNOSIS — E039 Hypothyroidism, unspecified: Secondary | ICD-10-CM | POA: Diagnosis not present

## 2018-08-26 DIAGNOSIS — J029 Acute pharyngitis, unspecified: Secondary | ICD-10-CM | POA: Diagnosis not present

## 2018-08-26 DIAGNOSIS — J069 Acute upper respiratory infection, unspecified: Secondary | ICD-10-CM | POA: Diagnosis not present

## 2018-08-26 DIAGNOSIS — J309 Allergic rhinitis, unspecified: Secondary | ICD-10-CM | POA: Diagnosis not present

## 2018-09-15 IMAGING — MG 2D DIGITAL SCREENING BILATERAL MAMMOGRAM WITH CAD AND ADJUNCT TO
9 of 13 series · 9 of 29 positions shown · non-contrast
Comparison: Previous exam(s).

CLINICAL DATA: Screening.

EXAM:
2D DIGITAL SCREENING BILATERAL MAMMOGRAM WITH CAD AND ADJUNCT TOMO

[R MLO (1 of 2)]
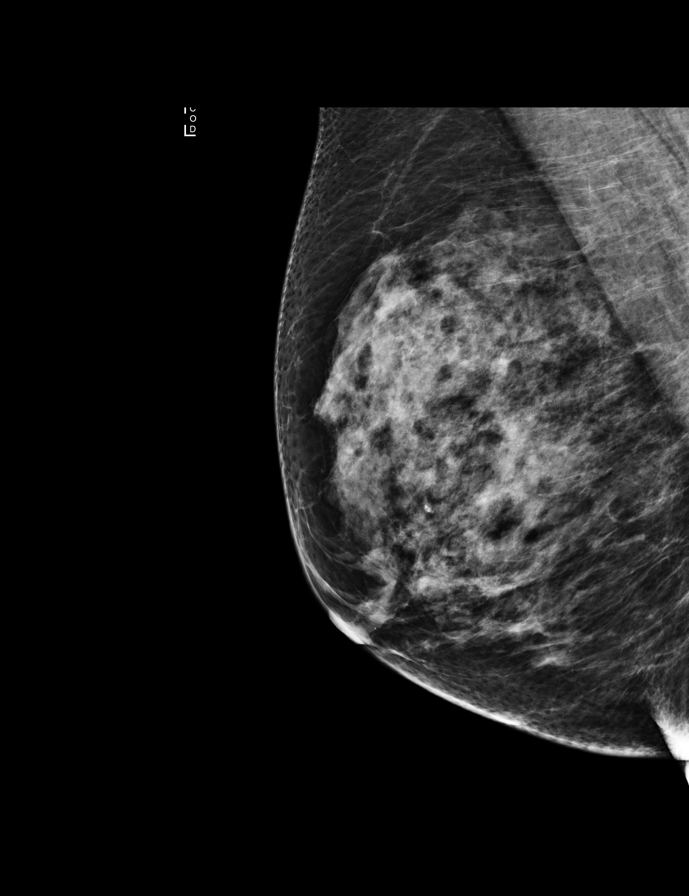

[R MLO synth-2D]
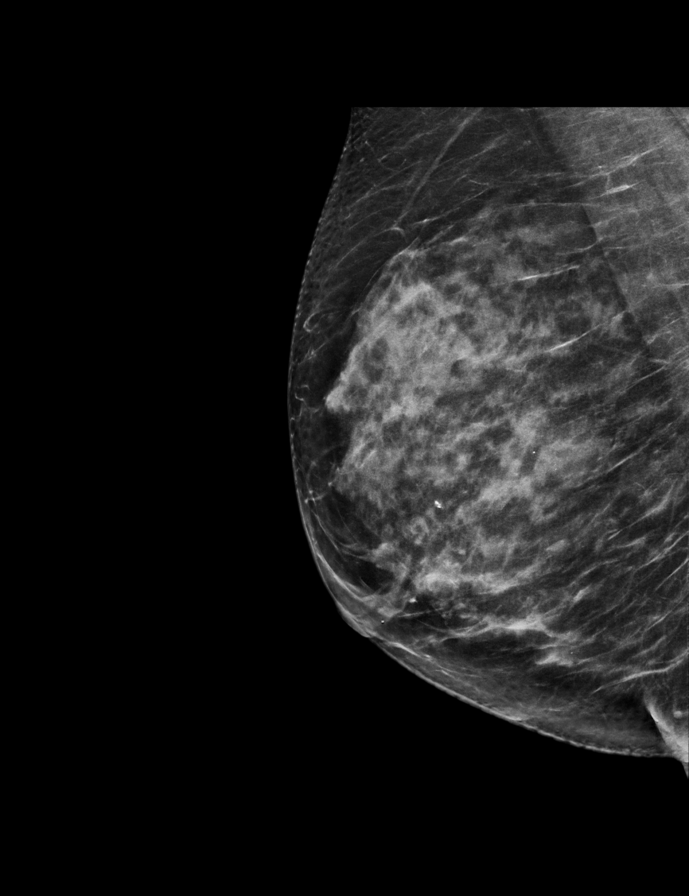

[L CC synth-2D]
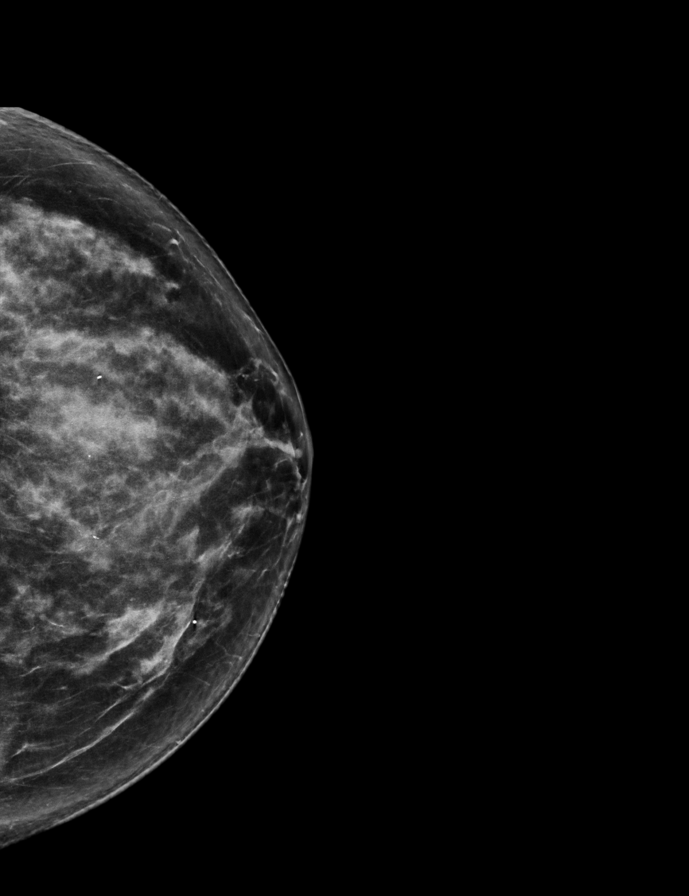

[L MLO synth-2D]
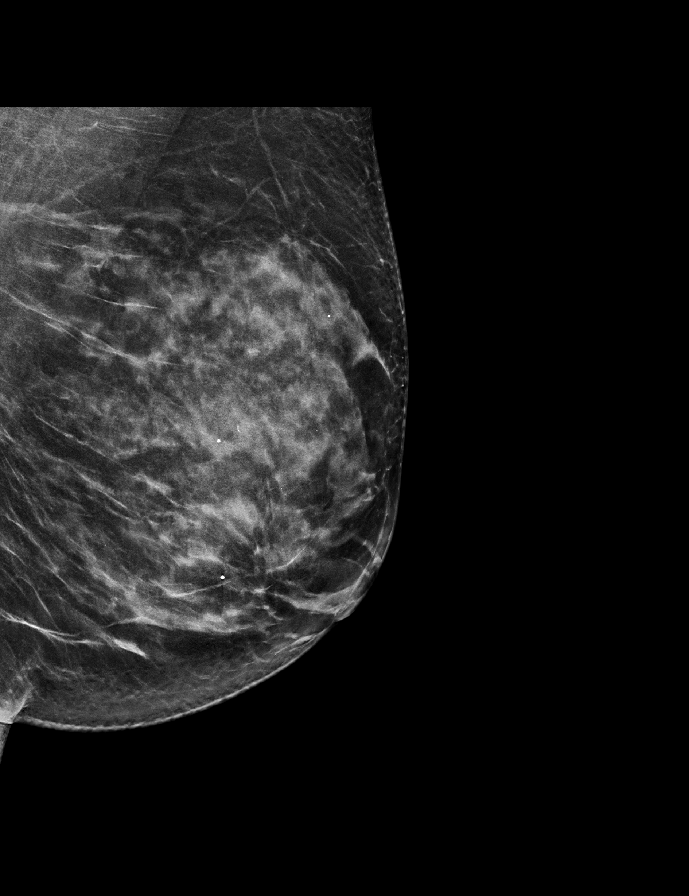

[R CC]
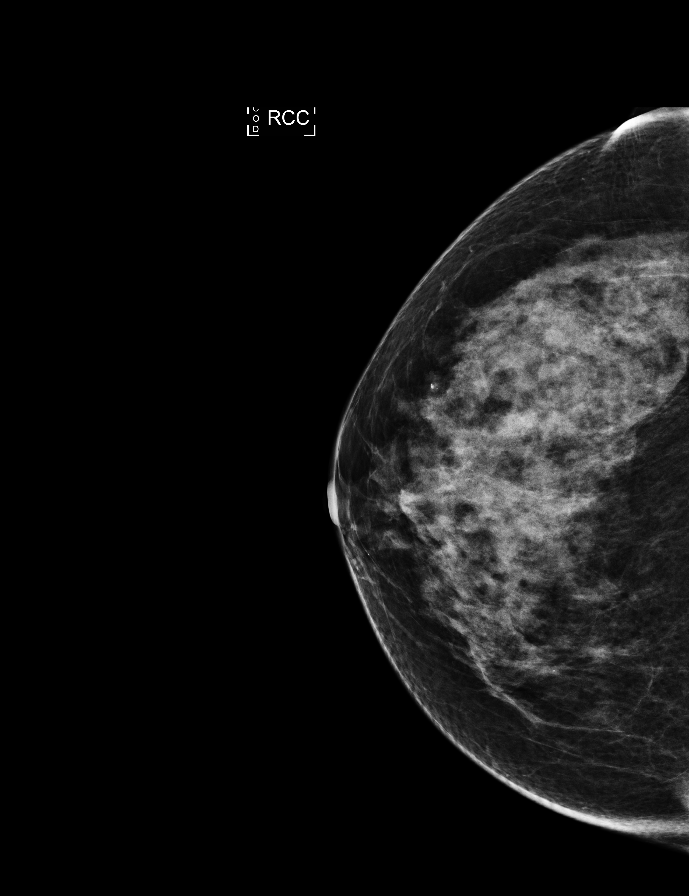

[R CC synth-2D]
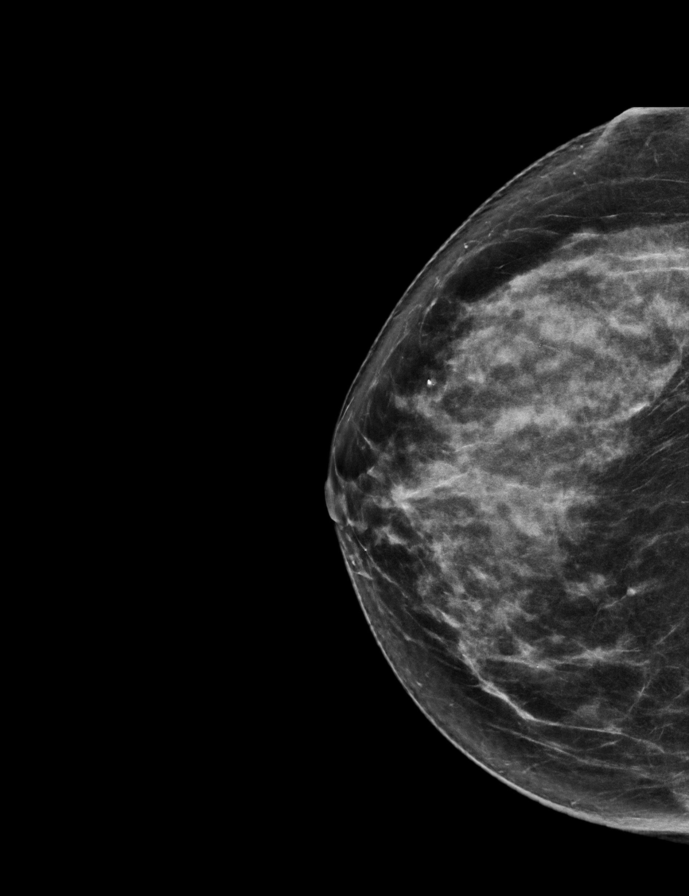

[L CC]
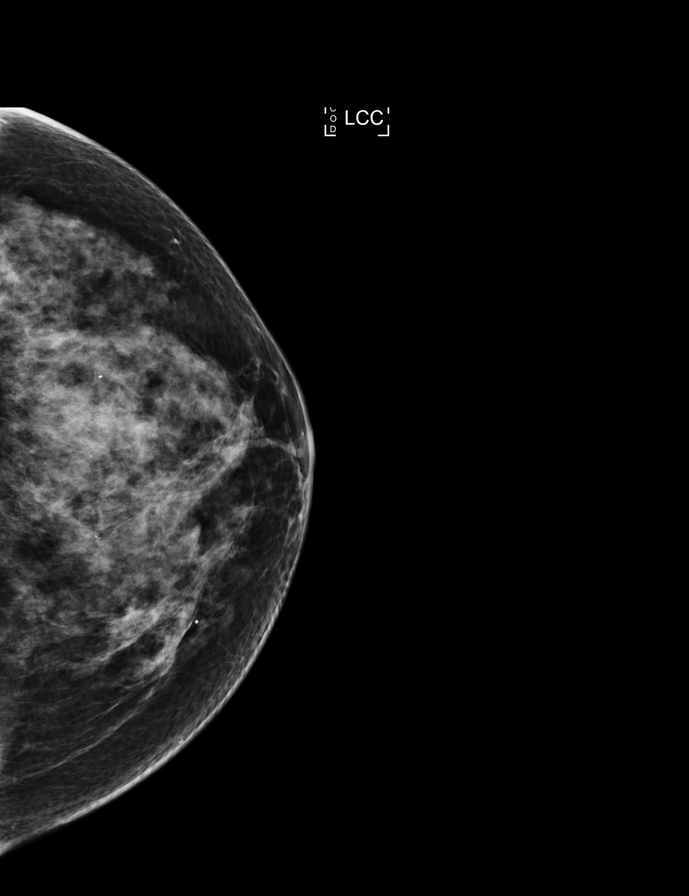

[L MLO]
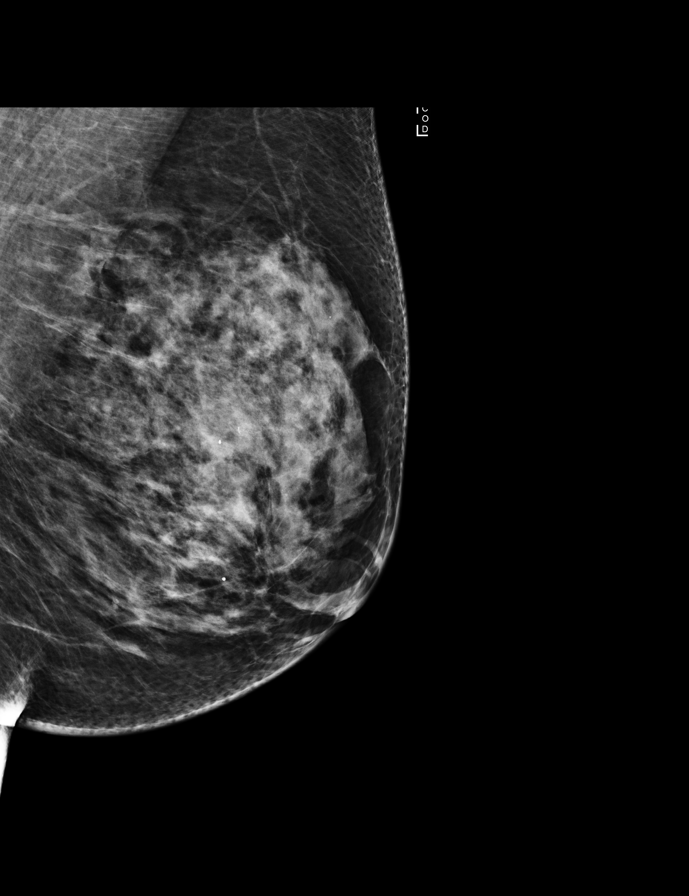

[R MLO (2 of 2)]
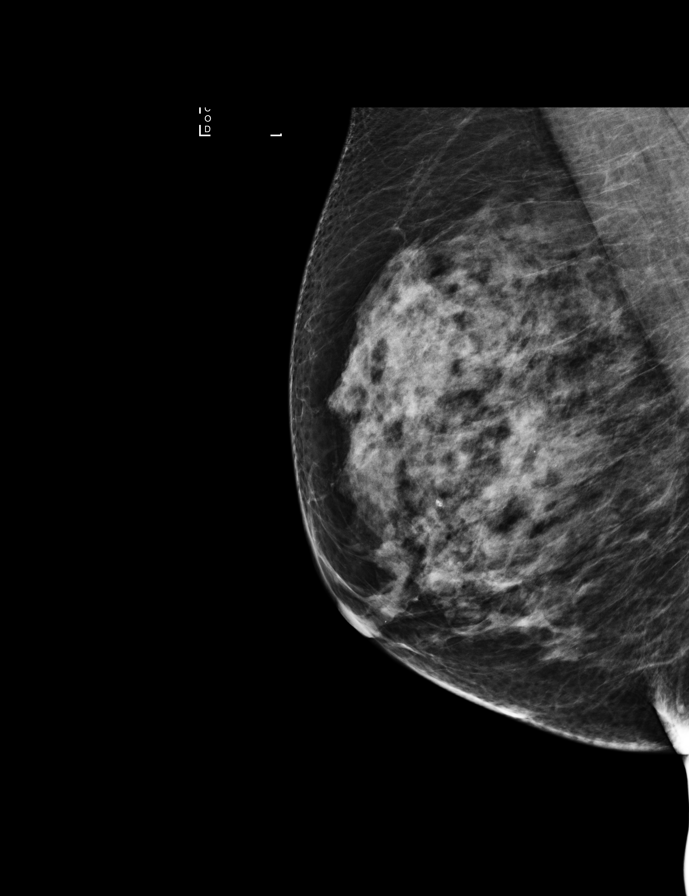

[9 of 29 positions shown; findings below may reference images not displayed]

ACR Breast Density Category c: The breast tissue is heterogeneously
dense, which may obscure small masses.
FINDINGS: There are no findings suspicious for malignancy. Images were
processed with CAD.
IMPRESSION: No mammographic evidence of malignancy. A result letter of this
screening mammogram will be mailed directly to the patient.

RECOMMENDATION:
Screening mammogram in one year. (Code:[id number]-[id number])

BI-RADS CATEGORY  1: Negative.

## 2018-12-15 DIAGNOSIS — F419 Anxiety disorder, unspecified: Secondary | ICD-10-CM | POA: Diagnosis not present

## 2018-12-15 DIAGNOSIS — G4700 Insomnia, unspecified: Secondary | ICD-10-CM | POA: Diagnosis not present

## 2019-04-17 ENCOUNTER — Other Ambulatory Visit: Payer: Self-pay | Admitting: Obstetrics and Gynecology

## 2019-04-17 DIAGNOSIS — Z1231 Encounter for screening mammogram for malignant neoplasm of breast: Secondary | ICD-10-CM

## 2019-06-01 ENCOUNTER — Ambulatory Visit
Admission: RE | Admit: 2019-06-01 | Discharge: 2019-06-01 | Disposition: A | Discharge status: Home / Self Care | Payer: BLUE CROSS/BLUE SHIELD | Source: Ambulatory Visit | Attending: Obstetrics and Gynecology | Admitting: Obstetrics and Gynecology

## 2019-06-01 ENCOUNTER — Other Ambulatory Visit: Payer: Self-pay

## 2019-06-01 DIAGNOSIS — Z1231 Encounter for screening mammogram for malignant neoplasm of breast: Secondary | ICD-10-CM

## 2019-07-11 DIAGNOSIS — F419 Anxiety disorder, unspecified: Secondary | ICD-10-CM | POA: Diagnosis not present

## 2019-07-11 DIAGNOSIS — E7800 Pure hypercholesterolemia, unspecified: Secondary | ICD-10-CM | POA: Diagnosis not present

## 2019-07-11 DIAGNOSIS — F324 Major depressive disorder, single episode, in partial remission: Secondary | ICD-10-CM | POA: Diagnosis not present

## 2019-07-11 DIAGNOSIS — Z0000 Encounter for general adult medical examination without abnormal findings: Secondary | ICD-10-CM | POA: Diagnosis not present

## 2019-07-11 DIAGNOSIS — M25562 Pain in left knee: Secondary | ICD-10-CM | POA: Diagnosis not present

## 2019-07-11 DIAGNOSIS — E039 Hypothyroidism, unspecified: Secondary | ICD-10-CM | POA: Diagnosis not present

## 2019-07-26 DIAGNOSIS — H6983 Other specified disorders of Eustachian tube, bilateral: Secondary | ICD-10-CM | POA: Diagnosis not present

## 2019-07-26 DIAGNOSIS — Z20828 Contact with and (suspected) exposure to other viral communicable diseases: Secondary | ICD-10-CM | POA: Diagnosis not present

## 2019-07-26 DIAGNOSIS — R062 Wheezing: Secondary | ICD-10-CM | POA: Diagnosis not present

## 2019-08-17 ENCOUNTER — Ambulatory Visit: Payer: BC Managed Care – PPO | Attending: Internal Medicine

## 2019-08-17 DIAGNOSIS — Z23 Encounter for immunization: Secondary | ICD-10-CM | POA: Insufficient documentation

## 2019-08-17 NOTE — Progress Notes (Signed)
   Covid-19 Vaccination Clinic  Name:  Jacqueline Dunn    MRN: [id number] DOB: 03/27/1951  08/17/2019  [name] was observed post Covid-19 immunization for 15 minutes without incidence. She was provided with Vaccine Information Sheet and instruction to access the V-Safe system.   [name] was instructed to call 911 with any severe reactions post vaccine: Marland Kitchen Difficulty breathing  . Swelling of your face and throat  . A fast heartbeat  . A bad rash all over your body  . Dizziness and weakness    Immunizations Administered    Name Date Dose VIS Date Route   Pfizer COVID-19 Vaccine 08/17/2019 10:35 AM 0.3 mL 07/07/2019 Intramuscular   Manufacturer: [place]   Lot: [id number]   [place]: [id number]

## 2019-09-05 ENCOUNTER — Ambulatory Visit: Payer: BC Managed Care – PPO | Attending: Internal Medicine

## 2019-09-05 DIAGNOSIS — Z23 Encounter for immunization: Secondary | ICD-10-CM

## 2019-09-05 NOTE — Progress Notes (Signed)
   Covid-19 Vaccination Clinic  Name:  Jacqueline Dunn    MRN: [id number] DOB: 06/08/1951  09/05/2019  [name] was observed post Covid-19 immunization for 15 minutes without incidence. She was provided with Vaccine Information Sheet and instruction to access the V-Safe system.   [name] was instructed to call 911 with any severe reactions post vaccine: Marland Kitchen Difficulty breathing  . Swelling of your face and throat  . A fast heartbeat  . A bad rash all over your body  . Dizziness and weakness    Immunizations Administered    Name Date Dose VIS Date Route   Pfizer COVID-19 Vaccine 09/05/2019  8:23 AM 0.3 mL 07/07/2019 Intramuscular   Manufacturer: [place]   Lot: [id number]   [place]: [id number]

## 2020-04-25 ENCOUNTER — Other Ambulatory Visit: Payer: Self-pay | Admitting: Obstetrics and Gynecology

## 2020-04-25 DIAGNOSIS — Z1231 Encounter for screening mammogram for malignant neoplasm of breast: Secondary | ICD-10-CM

## 2020-06-10 ENCOUNTER — Ambulatory Visit
Admission: RE | Admit: 2020-06-10 | Discharge: 2020-06-10 | Disposition: A | Discharge status: Home / Self Care | Payer: BC Managed Care – PPO | Source: Ambulatory Visit | Attending: Obstetrics and Gynecology | Admitting: Obstetrics and Gynecology

## 2020-06-10 ENCOUNTER — Other Ambulatory Visit: Payer: Self-pay

## 2020-06-10 DIAGNOSIS — Z1231 Encounter for screening mammogram for malignant neoplasm of breast: Secondary | ICD-10-CM

## 2020-08-15 DIAGNOSIS — E7800 Pure hypercholesterolemia, unspecified: Secondary | ICD-10-CM | POA: Diagnosis not present

## 2020-08-15 DIAGNOSIS — F419 Anxiety disorder, unspecified: Secondary | ICD-10-CM | POA: Diagnosis not present

## 2020-08-15 DIAGNOSIS — Z0000 Encounter for general adult medical examination without abnormal findings: Secondary | ICD-10-CM | POA: Diagnosis not present

## 2020-08-15 DIAGNOSIS — F324 Major depressive disorder, single episode, in partial remission: Secondary | ICD-10-CM | POA: Diagnosis not present

## 2020-08-15 DIAGNOSIS — R030 Elevated blood-pressure reading, without diagnosis of hypertension: Secondary | ICD-10-CM | POA: Diagnosis not present

## 2020-08-28 DIAGNOSIS — M25561 Pain in right knee: Secondary | ICD-10-CM | POA: Diagnosis not present

## 2020-08-28 DIAGNOSIS — M1712 Unilateral primary osteoarthritis, left knee: Secondary | ICD-10-CM | POA: Diagnosis not present

## 2020-08-28 DIAGNOSIS — M7918 Myalgia, other site: Secondary | ICD-10-CM | POA: Diagnosis not present

## 2020-08-28 DIAGNOSIS — Z96651 Presence of right artificial knee joint: Secondary | ICD-10-CM | POA: Diagnosis not present

## 2020-08-28 DIAGNOSIS — M5416 Radiculopathy, lumbar region: Secondary | ICD-10-CM | POA: Diagnosis not present

## 2020-09-16 DIAGNOSIS — M1990 Unspecified osteoarthritis, unspecified site: Secondary | ICD-10-CM | POA: Diagnosis not present

## 2020-09-16 DIAGNOSIS — I10 Essential (primary) hypertension: Secondary | ICD-10-CM | POA: Diagnosis not present

## 2021-01-20 DIAGNOSIS — M545 Low back pain, unspecified: Secondary | ICD-10-CM | POA: Diagnosis not present

## 2021-01-20 DIAGNOSIS — M5416 Radiculopathy, lumbar region: Secondary | ICD-10-CM | POA: Diagnosis not present

## 2021-02-11 DIAGNOSIS — M5416 Radiculopathy, lumbar region: Secondary | ICD-10-CM | POA: Diagnosis not present

## 2021-03-04 DIAGNOSIS — M5416 Radiculopathy, lumbar region: Secondary | ICD-10-CM | POA: Diagnosis not present

## 2021-03-05 DIAGNOSIS — Z86010 Personal history of colonic polyps: Secondary | ICD-10-CM | POA: Diagnosis not present

## 2021-03-05 DIAGNOSIS — K5730 Diverticulosis of large intestine without perforation or abscess without bleeding: Secondary | ICD-10-CM | POA: Diagnosis not present

## 2021-03-05 DIAGNOSIS — D120 Benign neoplasm of cecum: Secondary | ICD-10-CM | POA: Diagnosis not present

## 2021-03-25 DIAGNOSIS — M5416 Radiculopathy, lumbar region: Secondary | ICD-10-CM | POA: Diagnosis not present

## 2021-05-02 ENCOUNTER — Other Ambulatory Visit: Payer: Self-pay | Admitting: Obstetrics and Gynecology

## 2021-05-02 DIAGNOSIS — Z1231 Encounter for screening mammogram for malignant neoplasm of breast: Secondary | ICD-10-CM

## 2021-05-06 DIAGNOSIS — M5416 Radiculopathy, lumbar region: Secondary | ICD-10-CM | POA: Diagnosis not present

## 2021-06-11 ENCOUNTER — Ambulatory Visit
Admission: RE | Admit: 2021-06-11 | Discharge: 2021-06-11 | Disposition: A | Discharge status: Home / Self Care | Payer: BC Managed Care – PPO | Source: Ambulatory Visit | Attending: Obstetrics and Gynecology | Admitting: Obstetrics and Gynecology

## 2021-06-11 ENCOUNTER — Other Ambulatory Visit: Payer: Self-pay

## 2021-06-11 DIAGNOSIS — Z1231 Encounter for screening mammogram for malignant neoplasm of breast: Secondary | ICD-10-CM

## 2021-07-08 DIAGNOSIS — M5416 Radiculopathy, lumbar region: Secondary | ICD-10-CM | POA: Diagnosis not present

## 2021-08-05 DIAGNOSIS — M5416 Radiculopathy, lumbar region: Secondary | ICD-10-CM | POA: Diagnosis not present

## 2021-08-12 DIAGNOSIS — M5459 Other low back pain: Secondary | ICD-10-CM | POA: Diagnosis not present

## 2021-08-12 DIAGNOSIS — M5416 Radiculopathy, lumbar region: Secondary | ICD-10-CM | POA: Diagnosis not present

## 2021-08-13 DIAGNOSIS — R350 Frequency of micturition: Secondary | ICD-10-CM | POA: Diagnosis not present

## 2021-09-03 DIAGNOSIS — M5416 Radiculopathy, lumbar region: Secondary | ICD-10-CM | POA: Diagnosis not present

## 2021-09-10 DIAGNOSIS — M5416 Radiculopathy, lumbar region: Secondary | ICD-10-CM | POA: Diagnosis not present

## 2021-09-17 DIAGNOSIS — M5416 Radiculopathy, lumbar region: Secondary | ICD-10-CM | POA: Diagnosis not present

## 2021-09-23 DIAGNOSIS — M5416 Radiculopathy, lumbar region: Secondary | ICD-10-CM | POA: Diagnosis not present

## 2021-09-26 DIAGNOSIS — Z0000 Encounter for general adult medical examination without abnormal findings: Secondary | ICD-10-CM | POA: Diagnosis not present

## 2021-09-26 DIAGNOSIS — F419 Anxiety disorder, unspecified: Secondary | ICD-10-CM | POA: Diagnosis not present

## 2021-09-26 DIAGNOSIS — I10 Essential (primary) hypertension: Secondary | ICD-10-CM | POA: Diagnosis not present

## 2021-09-26 DIAGNOSIS — F324 Major depressive disorder, single episode, in partial remission: Secondary | ICD-10-CM | POA: Diagnosis not present

## 2021-09-26 DIAGNOSIS — E039 Hypothyroidism, unspecified: Secondary | ICD-10-CM | POA: Diagnosis not present

## 2021-09-26 DIAGNOSIS — E7800 Pure hypercholesterolemia, unspecified: Secondary | ICD-10-CM | POA: Diagnosis not present

## 2021-10-01 DIAGNOSIS — M5416 Radiculopathy, lumbar region: Secondary | ICD-10-CM | POA: Diagnosis not present

## 2021-10-07 DIAGNOSIS — M5416 Radiculopathy, lumbar region: Secondary | ICD-10-CM | POA: Diagnosis not present

## 2021-10-21 DIAGNOSIS — M5416 Radiculopathy, lumbar region: Secondary | ICD-10-CM | POA: Diagnosis not present

## 2022-02-23 DIAGNOSIS — L821 Other seborrheic keratosis: Secondary | ICD-10-CM | POA: Diagnosis not present

## 2022-02-23 DIAGNOSIS — Z1283 Encounter for screening for malignant neoplasm of skin: Secondary | ICD-10-CM | POA: Diagnosis not present

## 2022-02-23 DIAGNOSIS — L820 Inflamed seborrheic keratosis: Secondary | ICD-10-CM | POA: Diagnosis not present

## 2022-04-30 DIAGNOSIS — U071 COVID-19: Secondary | ICD-10-CM | POA: Diagnosis not present

## 2022-04-30 DIAGNOSIS — J069 Acute upper respiratory infection, unspecified: Secondary | ICD-10-CM | POA: Diagnosis not present

## 2022-05-12 ENCOUNTER — Other Ambulatory Visit: Payer: Self-pay | Admitting: Obstetrics and Gynecology

## 2022-05-12 DIAGNOSIS — Z1231 Encounter for screening mammogram for malignant neoplasm of breast: Secondary | ICD-10-CM

## 2022-06-25 ENCOUNTER — Ambulatory Visit
Admission: RE | Admit: 2022-06-25 | Discharge: 2022-06-25 | Disposition: A | Discharge status: Home / Self Care | Payer: BC Managed Care – PPO | Source: Ambulatory Visit | Attending: Obstetrics and Gynecology | Admitting: Obstetrics and Gynecology

## 2022-06-25 DIAGNOSIS — Z1231 Encounter for screening mammogram for malignant neoplasm of breast: Secondary | ICD-10-CM

## 2022-10-19 DIAGNOSIS — M5136 Other intervertebral disc degeneration, lumbar region: Secondary | ICD-10-CM | POA: Diagnosis not present

## 2022-10-19 DIAGNOSIS — M5416 Radiculopathy, lumbar region: Secondary | ICD-10-CM | POA: Diagnosis not present

## 2022-11-20 DIAGNOSIS — S0083XA Contusion of other part of head, initial encounter: Secondary | ICD-10-CM | POA: Diagnosis not present

## 2022-11-20 DIAGNOSIS — W19XXXA Unspecified fall, initial encounter: Secondary | ICD-10-CM | POA: Diagnosis not present

## 2022-12-22 DIAGNOSIS — M5416 Radiculopathy, lumbar region: Secondary | ICD-10-CM | POA: Diagnosis not present

## 2022-12-22 DIAGNOSIS — T402X5A Adverse effect of other opioids, initial encounter: Secondary | ICD-10-CM | POA: Diagnosis not present

## 2022-12-22 DIAGNOSIS — M5136 Other intervertebral disc degeneration, lumbar region: Secondary | ICD-10-CM | POA: Diagnosis not present

## 2023-03-23 DIAGNOSIS — T402X5A Adverse effect of other opioids, initial encounter: Secondary | ICD-10-CM | POA: Diagnosis not present

## 2023-03-23 DIAGNOSIS — M5136 Other intervertebral disc degeneration, lumbar region: Secondary | ICD-10-CM | POA: Diagnosis not present

## 2023-03-23 DIAGNOSIS — M5416 Radiculopathy, lumbar region: Secondary | ICD-10-CM | POA: Diagnosis not present

## 2023-05-25 ENCOUNTER — Other Ambulatory Visit: Payer: Self-pay | Admitting: Internal Medicine

## 2023-05-25 DIAGNOSIS — Z1231 Encounter for screening mammogram for malignant neoplasm of breast: Secondary | ICD-10-CM

## 2023-06-30 ENCOUNTER — Ambulatory Visit
Admission: RE | Admit: 2023-06-30 | Discharge: 2023-06-30 | Disposition: A | Discharge status: Home / Self Care | Payer: BC Managed Care – PPO | Source: Ambulatory Visit | Attending: Internal Medicine | Admitting: Internal Medicine

## 2023-06-30 DIAGNOSIS — Z1231 Encounter for screening mammogram for malignant neoplasm of breast: Secondary | ICD-10-CM | POA: Diagnosis not present

## 2023-08-25 ENCOUNTER — Other Ambulatory Visit: Payer: Self-pay | Admitting: Internal Medicine

## 2023-08-25 DIAGNOSIS — Z0000 Encounter for general adult medical examination without abnormal findings: Secondary | ICD-10-CM | POA: Diagnosis not present

## 2023-08-25 DIAGNOSIS — E2839 Other primary ovarian failure: Secondary | ICD-10-CM

## 2023-08-25 DIAGNOSIS — E039 Hypothyroidism, unspecified: Secondary | ICD-10-CM | POA: Diagnosis not present

## 2023-08-25 DIAGNOSIS — Z23 Encounter for immunization: Secondary | ICD-10-CM | POA: Diagnosis not present

## 2023-08-25 DIAGNOSIS — I10 Essential (primary) hypertension: Secondary | ICD-10-CM | POA: Diagnosis not present

## 2023-08-25 DIAGNOSIS — E7800 Pure hypercholesterolemia, unspecified: Secondary | ICD-10-CM | POA: Diagnosis not present

## 2023-08-28 ENCOUNTER — Ambulatory Visit
Admission: RE | Admit: 2023-08-28 | Discharge: 2023-08-28 | Disposition: A | Discharge status: Home / Self Care | Payer: BC Managed Care – PPO | Source: Ambulatory Visit | Attending: Internal Medicine | Admitting: Internal Medicine

## 2023-08-28 DIAGNOSIS — E2839 Other primary ovarian failure: Secondary | ICD-10-CM

## 2023-08-28 DIAGNOSIS — M8588 Other specified disorders of bone density and structure, other site: Secondary | ICD-10-CM | POA: Diagnosis not present

## 2023-08-28 DIAGNOSIS — N958 Other specified menopausal and perimenopausal disorders: Secondary | ICD-10-CM | POA: Diagnosis not present

## 2023-09-29 DIAGNOSIS — R42 Dizziness and giddiness: Secondary | ICD-10-CM | POA: Diagnosis not present

## 2023-09-29 DIAGNOSIS — R0981 Nasal congestion: Secondary | ICD-10-CM | POA: Diagnosis not present

## 2023-09-29 DIAGNOSIS — Z03818 Encounter for observation for suspected exposure to other biological agents ruled out: Secondary | ICD-10-CM | POA: Diagnosis not present

## 2023-09-29 DIAGNOSIS — H9209 Otalgia, unspecified ear: Secondary | ICD-10-CM | POA: Diagnosis not present

## 2023-09-29 DIAGNOSIS — F419 Anxiety disorder, unspecified: Secondary | ICD-10-CM | POA: Diagnosis not present

## 2023-11-18 DIAGNOSIS — M5416 Radiculopathy, lumbar region: Secondary | ICD-10-CM | POA: Diagnosis not present

## 2023-11-18 DIAGNOSIS — T402X5A Adverse effect of other opioids, initial encounter: Secondary | ICD-10-CM | POA: Diagnosis not present

## 2023-11-18 DIAGNOSIS — M545 Low back pain, unspecified: Secondary | ICD-10-CM | POA: Diagnosis not present

## 2024-03-31 DIAGNOSIS — K648 Other hemorrhoids: Secondary | ICD-10-CM | POA: Diagnosis not present

## 2024-03-31 DIAGNOSIS — Z860101 Personal history of adenomatous and serrated colon polyps: Secondary | ICD-10-CM | POA: Diagnosis not present

## 2024-03-31 DIAGNOSIS — K6389 Other specified diseases of intestine: Secondary | ICD-10-CM | POA: Diagnosis not present

## 2024-03-31 DIAGNOSIS — Z09 Encounter for follow-up examination after completed treatment for conditions other than malignant neoplasm: Secondary | ICD-10-CM | POA: Diagnosis not present

## 2024-03-31 DIAGNOSIS — D123 Benign neoplasm of transverse colon: Secondary | ICD-10-CM | POA: Diagnosis not present

## 2024-03-31 DIAGNOSIS — Z9889 Other specified postprocedural states: Secondary | ICD-10-CM | POA: Diagnosis not present

## 2024-04-19 DIAGNOSIS — K219 Gastro-esophageal reflux disease without esophagitis: Secondary | ICD-10-CM | POA: Diagnosis not present

## 2024-04-19 DIAGNOSIS — R1013 Epigastric pain: Secondary | ICD-10-CM | POA: Diagnosis not present

## 2024-05-17 DIAGNOSIS — K3189 Other diseases of stomach and duodenum: Secondary | ICD-10-CM | POA: Diagnosis not present

## 2024-05-17 DIAGNOSIS — K2930 Chronic superficial gastritis without bleeding: Secondary | ICD-10-CM | POA: Diagnosis not present

## 2024-05-17 DIAGNOSIS — Q399 Congenital malformation of esophagus, unspecified: Secondary | ICD-10-CM | POA: Diagnosis not present

## 2024-05-17 DIAGNOSIS — K449 Diaphragmatic hernia without obstruction or gangrene: Secondary | ICD-10-CM | POA: Diagnosis not present

## 2024-05-17 DIAGNOSIS — R1013 Epigastric pain: Secondary | ICD-10-CM | POA: Diagnosis not present

## 2024-05-17 DIAGNOSIS — K219 Gastro-esophageal reflux disease without esophagitis: Secondary | ICD-10-CM | POA: Diagnosis not present

## 2024-05-29 ENCOUNTER — Other Ambulatory Visit: Payer: Self-pay | Admitting: Internal Medicine

## 2024-05-29 DIAGNOSIS — Z1231 Encounter for screening mammogram for malignant neoplasm of breast: Secondary | ICD-10-CM

## 2024-06-30 ENCOUNTER — Ambulatory Visit
Admission: RE | Admit: 2024-06-30 | Discharge: 2024-06-30 | Disposition: A | Source: Ambulatory Visit | Attending: Internal Medicine | Admitting: Internal Medicine

## 2024-06-30 DIAGNOSIS — Z1231 Encounter for screening mammogram for malignant neoplasm of breast: Secondary | ICD-10-CM
